# Patient Record
Sex: Male | Born: 1985 | Hispanic: No | Marital: Married | Smoking: Never smoker
Health system: Southern US, Community
[De-identification: ages and names within clinical notes are randomized; demographics above are authoritative.]

## PROBLEM LIST (undated history)

## (undated) DIAGNOSIS — G629 Polyneuropathy, unspecified: Secondary | ICD-10-CM

## (undated) DIAGNOSIS — K219 Gastro-esophageal reflux disease without esophagitis: Secondary | ICD-10-CM

## (undated) HISTORY — DX: Polyneuropathy, unspecified: G62.9

## (undated) HISTORY — PX: OTHER SURGICAL HISTORY: SHX169

---

## 2014-09-11 ENCOUNTER — Encounter: Payer: Self-pay | Admitting: Neurology

## 2014-09-11 ENCOUNTER — Ambulatory Visit (INDEPENDENT_AMBULATORY_CARE_PROVIDER_SITE_OTHER): Payer: BC Managed Care – HMO | Admitting: Neurology

## 2014-09-11 VITALS — BP 113/70 | HR 77 | Temp 97.2°F | Ht 67.0 in | Wt 153.0 lb

## 2014-09-11 DIAGNOSIS — M5481 Occipital neuralgia: Secondary | ICD-10-CM

## 2014-09-11 DIAGNOSIS — M542 Cervicalgia: Secondary | ICD-10-CM

## 2014-09-11 DIAGNOSIS — R29898 Other symptoms and signs involving the musculoskeletal system: Secondary | ICD-10-CM

## 2014-09-11 DIAGNOSIS — R209 Unspecified disturbances of skin sensation: Secondary | ICD-10-CM

## 2014-09-11 DIAGNOSIS — H9312 Tinnitus, left ear: Secondary | ICD-10-CM

## 2014-09-11 DIAGNOSIS — H539 Unspecified visual disturbance: Secondary | ICD-10-CM

## 2014-09-11 DIAGNOSIS — R200 Anesthesia of skin: Secondary | ICD-10-CM

## 2014-09-11 DIAGNOSIS — R208 Other disturbances of skin sensation: Secondary | ICD-10-CM

## 2014-09-11 DIAGNOSIS — IMO0001 Reserved for inherently not codable concepts without codable children: Secondary | ICD-10-CM

## 2014-09-11 NOTE — Progress Notes (Signed)
[facility]    Provider:  Dr Lucia GaskinsAhern Referring Provider: Swazil[place]Jordan, Betty G, MD Primary Care Physician:  SwazilandJORDAN, BETTY G, MD   CC:  Numbness  HPI:  Jonathan Rodriguez is a 28 y.o. male here as a referral from Dr. SwazilandJordan for left interscapular numbness and cool sensation LUE. He has no significant PMHX. He started doing weights 2 months ago. He hurt his bicep and left superior pec, felt soreness after a workout. No tearing sensation or trauma. He has pain in the neck, travels into the shoulder. Descibed as tingling in the scapular region and travels down the arm to digits 4-5 of the left arm. Feels a pulling sensation into digits 4-5 and prickling and needles in the thumb. Feels weakness in the arm. General weakness when he lifts weight, he can lift weight but not as strong. No grip weakness. Symptoms wax and wane, worse at night, 4/10 pain. No other associated symptoms. He has pain that radiates to the left occipital area as well. Pressure and numbness in the occipital and trigeminal areas. He has ringing in the ears. Tenderness and pain in the occipital area. Lightning bolts. Episodic blurred vision.   Review of Systems: Patient complains of symptoms per HPI as well as the following symptoms: weight gain, fatigue, blurred vision, swelling in legs, joint pain, cramps, aching muscles, moles, headache, sleepiness, passing out, anxiety. Pertinent negatives per HPI. All others negative.   History   Social History  . Marital Status: Unknown    Spouse Name: N/A    Number of Children: N/A  . Years of Education: N/A   Occupational History  . Not on file.   Social History Main Topics  . Smoking status: Never Smoker   . Smokeless tobacco: Never Used  . Alcohol Use: No  . Drug Use: No  . Sexual Activity: Not Currently   Other Topics Concern  . Not on file   Social History Narrative    Family History  Problem Relation Age of Onset  . Heart attack Mother   . Heart attack  Paternal Grandfather     Past Medical History  Diagnosis Date  . Neuropathy     Past Surgical History  Procedure Laterality Date  . No surgical history      Current Outpatient Prescriptions  Medication Sig Dispense Refill  . tiZANidine (ZANAFLEX) 4 MG tablet Take 4 mg by mouth every 6 (six) hours as needed for muscle spasms.     No current facility-administered medications for this visit.    Allergies as of 09/11/2014 - Review Complete 09/11/2014  Allergen Reaction Noted  . Naproxen Tinitus 09/11/2014    Vitals: BP 113/70 mmHg  Pulse 77  Temp(Src) 97.2 F (36.2 C) (Oral)  Ht 5\' 7"  (1.702 m)  Wt 153 lb (69.4 kg)  BMI 23.96 kg/m2 Last Weight:  Wt Readings from Last 1 Encounters:  09/11/14 153 lb (69.4 kg)   Last Height:   Ht Readings from Last 1 Encounters:  09/11/14 5\' 7"  (1.702 m)     Physical exam: Exam: Gen: NAD, conversant, well nourised, well groomed                     CV: RRR, no MRG. No Carotid Bruits. No peripheral edema, warm, nontender Eyes: Conjunctivae clear without exudates or hemorrhage  Neuro: Detailed Neurologic Exam  Speech:    Speech is normal; fluent and spontaneous with normal comprehension.  Cognition:    The patient is oriented to  person, place, and time;     recent and remote memory intact;     language fluent;     normal attention, concentration,     fund of knowledge Cranial Nerves:    The pupils are equal, round, and reactive to light. The fundi are normal and spontaneous venous pulsations are present. Visual fields are full to finger confrontation. Extraocular movements are intact. Trigeminal sensation is intact and the muscles of mastication are normal. The face is symmetric. The palate elevates in the midline. Voice is normal. Shoulder shrug is normal. The tongue has normal motion without fasciculations.   Coordination:    Normal finger to nose and heel to shin. Normal rapid alternating movements.   Gait:    Heel-toe and  tandem gait are normal.   Motor Observation:    No asymmetry, no atrophy, and no involuntary movements noted. Tone:    Normal muscle tone.    Posture:    Posture is normal. normal erect    Strength:    Mild weakness of infraspinatus. Strength is V/V in the upper and lower limbs including intrinsic hand muscles.      Sensation: intact to LT     Reflex Exam:  DTR's:    3+ patellar reflexes with + suprapatellar reflexes. Otherwise deep tendon reflexes in the upper and lower extremities are normal bilaterally.   Toes:    The toes are downgoing bilaterally.   Clonus:    Clonus is absent.       Assessment/Plan:  28 year old male with new onset neck pain with radiation into the arm and occipital neuralgia. Will evaluate left arm pain with EMG/NCS of the left upper extremity. Because patient is c/o vision and hearing changes and because structural and infiltrative lesions may cause occipital neuralgia will order MRI of the brain and cervical spine as well.   Naomie DeanAntonia Ahern, MD  [facility] [address] [place], [place] [id number]  Phone [phone] Fax [phone]

## 2014-09-11 NOTE — Patient Instructions (Signed)
Overall you are doing fairly well but I do want to suggest a few things today:   Remember to drink plenty of fluid, eat healthy meals and do not skip any meals. Try to eat protein with a every meal and eat a healthy snack such as fruit or nuts in between meals. Try to keep a regular sleep-wake schedule and try to exercise daily, particularly in the form of walking, 20-30 minutes a day, if you can.   As far as your medications are concerned, I would like to suggest  As far as diagnostic testing: MRI of the brain and neck, emg/ncs  I would like to see you back in one week for emg/ncs, sooner if we need to. Please call us with any interim questions, concerns, problems, updates or refill requests.   Please also call us for any test results so we can go over those with you on the phone.  My clinical assistant and will answer any of your questions and relay your messages to me and also relay most of my messages to you.   Our phone number is [phone]. We also have an after hours call service for urgent matters and there is a physician on-call for urgent questions. For any emergencies you know to call 911 or go to the nearest emergency room

## 2014-09-12 ENCOUNTER — Encounter: Payer: Self-pay | Admitting: Neurology

## 2014-09-12 DIAGNOSIS — H539 Unspecified visual disturbance: Secondary | ICD-10-CM | POA: Insufficient documentation

## 2014-09-12 DIAGNOSIS — M5481 Occipital neuralgia: Secondary | ICD-10-CM | POA: Insufficient documentation

## 2014-09-12 DIAGNOSIS — H9319 Tinnitus, unspecified ear: Secondary | ICD-10-CM | POA: Insufficient documentation

## 2014-09-12 DIAGNOSIS — M542 Cervicalgia: Secondary | ICD-10-CM | POA: Insufficient documentation

## 2014-09-19 ENCOUNTER — Ambulatory Visit (INDEPENDENT_AMBULATORY_CARE_PROVIDER_SITE_OTHER): Payer: BC Managed Care – HMO

## 2014-09-19 DIAGNOSIS — R29898 Other symptoms and signs involving the musculoskeletal system: Secondary | ICD-10-CM

## 2014-09-19 DIAGNOSIS — R208 Other disturbances of skin sensation: Secondary | ICD-10-CM

## 2014-09-19 DIAGNOSIS — IMO0001 Reserved for inherently not codable concepts without codable children: Secondary | ICD-10-CM

## 2014-09-19 DIAGNOSIS — M5481 Occipital neuralgia: Secondary | ICD-10-CM

## 2014-09-19 DIAGNOSIS — R209 Unspecified disturbances of skin sensation: Secondary | ICD-10-CM

## 2014-09-19 DIAGNOSIS — R200 Anesthesia of skin: Secondary | ICD-10-CM

## 2014-09-25 ENCOUNTER — Ambulatory Visit (INDEPENDENT_AMBULATORY_CARE_PROVIDER_SITE_OTHER): Payer: BC Managed Care – HMO | Admitting: Neurology

## 2014-09-25 ENCOUNTER — Ambulatory Visit (INDEPENDENT_AMBULATORY_CARE_PROVIDER_SITE_OTHER): Payer: Self-pay | Admitting: Neurology

## 2014-09-25 DIAGNOSIS — Z0289 Encounter for other administrative examinations: Secondary | ICD-10-CM

## 2014-09-25 DIAGNOSIS — IMO0001 Reserved for inherently not codable concepts without codable children: Secondary | ICD-10-CM

## 2014-09-25 DIAGNOSIS — R29898 Other symptoms and signs involving the musculoskeletal system: Secondary | ICD-10-CM

## 2014-09-25 DIAGNOSIS — R209 Unspecified disturbances of skin sensation: Secondary | ICD-10-CM

## 2014-09-25 DIAGNOSIS — G243 Spasmodic torticollis: Secondary | ICD-10-CM

## 2014-09-25 DIAGNOSIS — S43422D Sprain of left rotator cuff capsule, subsequent encounter: Secondary | ICD-10-CM

## 2014-09-25 DIAGNOSIS — R202 Paresthesia of skin: Secondary | ICD-10-CM

## 2014-09-25 NOTE — Progress Notes (Signed)
  [facility]    Provider:  Dr Lucia GaskinsAhern Referring Provider: [place]il[place]Jordan, Betty G, MD Primary Care Physician:  SwazilandJORDAN, BETTY G, MD  HPI:  Jonathan Rodriguez is a 28 y.o. male here as a referral from Dr. SwazilandJordan for tingling in the left rotator cuff area and lower border of the medial left scapula, feels tightness. He has no significant PMHX. Symptoms started 2 months ago after doing heavy weights, he felt some pain the digits 4-5 and in the pectorals after chest press. He hurt his bicep and left superior pec, felt soreness after a workout. No tearing sensation or trauma.Has has some pain in the left side of the neck that travels into the shoulder. He also had some coldness in the left side of the face.  He also has shooting pain in his left triceps. Exam demonstrates decreased ROM in the neck and horizontal turning of the head (torticollis), left-sided cervical increased tone.   Summary  Nerve conduction studies were performed on the left upper extremity  The left Median motor nerve showed normal conductions with normal F Wave latency The left Ulnar motor nerve showed normal conductions with normal F Wave latency The left second-digit Median sensory nerve conduction was within normal limits The left fifth-digit Ulnar sensory nerve conduction was within normal limits The left Radial sensory nerve conduction was within normal limits The left Medial Antebrachial Cutaneous sensory nerve conduction was within normal limits The left Median/Ulnar (palm) comparison nerve showed normal peak latency difference  EMG Needle study was performed on selected left upper extremity and paraspinal muscles:   The Left upper Trapezius, C6/C7/C8 paraspinals and Levator showed a persistent full interference pattern. The Infraspinatus, Supraspinatus, Deltoid, Triceps, Pronator Teres, Flexor Digitorum Profundus (Ulnar), Opponens Pollicis, First Dorsal interosseous muscles were within normal  limits.  Conclusion: No electrophysiologic evidence for ulnar or median neuropathy, peripheral polyneuropathy or radiculopathy. Cervical Dystonia was appreciated on EMG needle exam. Clinical correlation recommended.       Naomie DeanAntonia Ahern, MD  [facility] [address] [place], [place] [address]  Phone [phone] Fax [phone]

## 2014-09-27 MED ORDER — TIZANIDINE HCL 4 MG PO TABS: 4.0000 mg | ORAL_TABLET | Freq: Three times a day (TID) | ORAL | Status: DC | PRN

## 2014-09-27 NOTE — Addendum Note (Signed)
Addended by: Naomie DeanAHERN, ANTONIA B on: 09/27/2014 11:03 AM   Modules accepted: Orders

## 2014-10-02 ENCOUNTER — Telehealth: Payer: Self-pay

## 2014-10-02 NOTE — Telephone Encounter (Signed)
Spoke to patient. Gave results.

## 2014-10-02 NOTE — Telephone Encounter (Signed)
-----   Message from Anson FretAntonia B Ahern, MD sent at 09/23/2014  8:23 PM EST ----- Please let patient know the MRI of his cervical spine and MRI of his brain were both normal. Thank you.

## 2014-10-03 ENCOUNTER — Encounter: Payer: Self-pay | Admitting: Neurology

## 2014-10-03 ENCOUNTER — Ambulatory Visit (INDEPENDENT_AMBULATORY_CARE_PROVIDER_SITE_OTHER): Payer: BC Managed Care – HMO | Admitting: Neurology

## 2014-10-03 VITALS — BP 102/66 | HR 65 | Ht 67.0 in | Wt 154.0 lb

## 2014-10-03 DIAGNOSIS — R339 Retention of urine, unspecified: Secondary | ICD-10-CM

## 2014-10-03 DIAGNOSIS — R42 Dizziness and giddiness: Secondary | ICD-10-CM

## 2014-10-03 DIAGNOSIS — M545 Low back pain: Secondary | ICD-10-CM

## 2014-10-03 DIAGNOSIS — R29898 Other symptoms and signs involving the musculoskeletal system: Secondary | ICD-10-CM

## 2014-10-03 DIAGNOSIS — H9313 Tinnitus, bilateral: Secondary | ICD-10-CM

## 2014-10-03 MED ORDER — CARISOPRODOL 350 MG PO TABS: 350.0000 mg | ORAL_TABLET | Freq: Two times a day (BID) | ORAL | Status: DC

## 2014-10-03 NOTE — Progress Notes (Signed)
[facility]    Provider:  Dr Lucia GaskinsAhern Referring Provider: SwazilandJordan, Betty G, MD Primary Care Physician:  SwazilandJORDAN, BETTY G, MD  CC: Back pain  Interval History: He is going to physical therapy for his arm and back. He is going weekly. His left hip is tight and so are shoulders in the back. He had a massage which relaxed his shoulder muscles. PT suggested postures and he is feeling better. He has burning pain in the low back and sacral area. He feels weakness in the legs. Burning radiates into the buttocks.  Burning pain across the low back. Worse when he sleeps on his low back. Coldness in is feet with parestheias down the legs. He has urgency and feeling of urinary retension.   He has continuous ringing in the ears. This started after taking naproxen for his arm and back pain 2 months ago. Ringing is persistent and disturbing. No hearing loss, No vertigo. He endorses congestion. He uses noise cancellation headsets but stopped using them because the the noise in his head is worse than outside his head.   November 11th: Jonathan Rodriguez is a 28 y.o. male here as a referral from Dr. Sw[place]il[place]Jordan for left interscapular numbness and cool sensation LUE. He has no significant PMHX. He started doing weights 2 months ago. He hurt his bicep and left superior pec, felt soreness after a workout. No tearing sensation or trauma. He has pain in the neck, travels into the shoulder. Descibed as tingling in the scapular region and travels down the arm to digits 4-5 of the left arm. Feels a pulling sensation into digits 4-5 and prickling and needles in the thumb. Feels weakness in the arm. General weakness when he lifts weight, he can lift weight but not as strong. No grip weakness. Symptoms wax and wane, worse at night, 4/10 pain. No other associated symptoms. He has pain that radiates to the left occipital area as well. Pressure and numbness in the occipital and trigeminal areas. He has ringing in the  ears. Tenderness and pain in the occipital area. Lightning bolts. Episodic blurred vision.   Review of Systems: Patient complains of symptoms per HPI as well as the following symptoms: Dizziness, fatigue, blurred vision, joint pain, cramps, aching muscles, moles, headache, sleepiness,  anxiety. Pertinent negatives per HPI. All others negative.   History   Social History  . Marital Status: Unknown    Spouse Name: N/A    Number of Children: N/A  . Years of Education: N/A   Occupational History  . Not on file.   Social History Main Topics  . Smoking status: Never Smoker   . Smokeless tobacco: Never Used  . Alcohol Use: No  . Drug Use: No  . Sexual Activity: Not Currently   Other Topics Concern  . Not on file   Social History Narrative   Patient lives alone.    Patient is single   Patient has a Masters degree   Patient is right handed     Family History  Problem Relation Age of Onset  . Heart attack Mother   . Heart attack Paternal Grandfather     Past Medical History  Diagnosis Date  . Neuropathy     Past Surgical History  Procedure Laterality Date  . No surgical history      Current Outpatient Prescriptions  Medication Sig Dispense Refill  . tiZANidine (ZANAFLEX) 4 MG tablet Take 1 tablet (4 mg total) by mouth every 8 (eight) hours  as needed for muscle spasms. 30 tablet 3  . carisoprodol (SOMA) 350 MG tablet Take 1 tablet (350 mg total) by mouth 2 (two) times daily. 60 tablet 3   No current facility-administered medications for this visit.    Allergies as of 10/03/2014 - Review Complete 10/03/2014  Allergen Reaction Noted  . Naproxen Tinitus 09/11/2014    Vitals: BP 102/66 mmHg  Pulse 65  Ht 5\' 7"  (1.702 m)  Wt 154 lb (69.854 kg)  BMI 24.11 kg/m2 Last Weight:  Wt Readings from Last 1 Encounters:  10/03/14 154 lb (69.854 kg)   Last Height:   Ht Readings from Last 1 Encounters:  10/03/14 5\' 7"  (1.702 m)    Physical exam: Exam: Gen: NAD,  conversant, well nourised, well groomed                     CV: RRR, no MRG. No Carotid Bruits. No peripheral edema, warm, nontender Eyes: Conjunctivae clear without exudates or hemorrhage  Neuro: Detailed Neurologic Exam  Speech:    Speech is normal; fluent and spontaneous with normal comprehension.  Cognition:    The patient is oriented to person, place, and time;     recent and remote memory intact;     language fluent;     normal attention, concentration,     fund of knowledge Cranial Nerves:    The pupils are equal, round, and reactive to light. The fundi are normal and spontaneous venous pulsations are present. Visual fields are full to finger confrontation. Extraocular movements are intact. Trigeminal sensation is intact and the muscles of mastication are normal. The face is symmetric. The palate elevates in the midline. Voice is normal. Shoulder shrug is normal. The tongue has normal motion without fasciculations.   Coordination:    Normal finger to nose and heel to shin. Normal rapid alternating movements.   Gait:    Heel-toe and tandem gait are normal.   Motor Observation:    No asymmetry, no atrophy, and no involuntary movements noted. Tone:    Normal muscle tone.    Posture:    Posture is normal. normal erect    Strength:    Strength is V/V in the upper and lower limbs.      Sensation: intact     Reflex Exam:  DTR's:    Brisk patellars and +suprapatellars. Otherwise deep tendon reflexes in the upper and lower extremities are normal bilaterally.   Toes:    The toes are downgoing bilaterally.   Clonus:    Clonus is absent.      Assessment/Plan:  28 year old male with no significant PMHx here for follow up with multiple neurologic and somatic complaints. Suspect some anxiety. Was seen originally for new onset neck pain with radiation into the arm and occipital neuralgia. EMG/NCS left arm was essentially normal. MRI of the brain and cervical spine were  unremarkable. Neuro exam with brisk patellar reflexes and suprapatellar reflexes, otherwise WNL. Today he complains of LBP and ringing in the ears.   Tinnitus: Will refer to ENT for persistent, bothersome, ringing in the ears after taking Naproxen. LBP with radiation into the buttocks, paresthesias in the legs, urinary changes,weakness: MRI of the lumbar spine Continue PT for arm pain and LBP  Naomie DeanAntonia Ahern, MD  [facility] [address] [place], [place] [id number]  Phone [phone] Fax [phone]

## 2014-10-03 NOTE — Patient Instructions (Signed)
Overall you are doing fairly well but I do want to suggest a few things today:   Remember to drink plenty of fluid, eat healthy meals and do not skip any meals. Try to eat protein with a every meal and eat a healthy snack such as fruit or nuts in between meals. Try to keep a regular sleep-wake schedule and try to exercise daily, particularly in the form of walking, 20-30 minutes a day, if you can.   As far as diagnostic testing: MRI of the lumbar spine, ENT referral, continue PT  I would like to see you back in 3 months, sooner if we need to. Please call us with any interim questions, concerns, problems, updates or refill requests.   Please also call us for any test results so we can go over those with you on the phone.  My clinical assistant and will answer any of your questions and relay your messages to me and also relay most of my messages to you.   Our phone number is [phone]. We also have an after hours call service for urgent matters and there is a physician on-call for urgent questions. For any emergencies you know to call 911 or go to the nearest emergency room

## 2014-10-06 DIAGNOSIS — M545 Low back pain, unspecified: Secondary | ICD-10-CM | POA: Insufficient documentation

## 2014-10-09 ENCOUNTER — Ambulatory Visit (INDEPENDENT_AMBULATORY_CARE_PROVIDER_SITE_OTHER): Payer: BC Managed Care – HMO

## 2014-10-09 DIAGNOSIS — M545 Low back pain: Secondary | ICD-10-CM

## 2014-10-09 DIAGNOSIS — R29898 Other symptoms and signs involving the musculoskeletal system: Secondary | ICD-10-CM

## 2014-10-09 DIAGNOSIS — S43422D Sprain of left rotator cuff capsule, subsequent encounter: Secondary | ICD-10-CM

## 2014-10-09 DIAGNOSIS — R339 Retention of urine, unspecified: Secondary | ICD-10-CM

## 2014-10-14 ENCOUNTER — Telehealth: Payer: Self-pay | Admitting: Neurology

## 2014-10-14 NOTE — Telephone Encounter (Signed)
Spoke to patient regarding MRI lumbar spine results. Mild degenerative changes.  Equivocal MRI lumbar spine (without) demonstrating: 1. Trivial disc bulging from L3-4 to L5-S1, and multi-level facet hypertrophy. 2. No spinal stenosis or foraminal narrowing.

## 2014-10-18 ENCOUNTER — Other Ambulatory Visit: Payer: Self-pay | Admitting: Neurology

## 2014-10-18 ENCOUNTER — Telehealth: Payer: Self-pay | Admitting: Neurology

## 2014-10-18 DIAGNOSIS — S43422A Sprain of left rotator cuff capsule, initial encounter: Secondary | ICD-10-CM

## 2014-10-18 NOTE — Telephone Encounter (Signed)
Called patient. Reffered to Ortho after MRI of the left shoulder showed apparent degenerative signal fraying of the posterior labrum without evidence of displaced tear, mild tendinosis of the supraspinatus without tear, Type II acromion with prominent posterior inferior tilt. Will leave MRI CD and report at the front desk for patient to pick up.

## 2014-10-21 ENCOUNTER — Encounter: Payer: Self-pay | Admitting: Neurology

## 2015-01-28 ENCOUNTER — Ambulatory Visit: Payer: BC Managed Care – HMO | Admitting: Neurology

## 2016-03-22 ENCOUNTER — Telehealth: Payer: Self-pay | Admitting: Neurology

## 2016-03-22 NOTE — Telephone Encounter (Signed)
Called pt back. Made earlier f/u for 03/31/16 at 4pm, check in 345pm. I cx 6.6.17 appt. He states he does not have PCP right now. He is in process of establishing himself with new PCP. I advised him to go to ED if he has new/worsening sx in the meantime before he comes for f/u. He verbalized understanding.

## 2016-03-22 NOTE — Telephone Encounter (Signed)
Patient reports new onset of HA's that started on Friday evening 03/19/16.  An appointment was made for 04/06/16. Pt is requesting to be seen sooner, operator advised that the nurse would call and let him know if he could be seen sooner.

## 2016-03-22 NOTE — Telephone Encounter (Signed)
Per Dr Lucia GaskinsAhern- ok to fit pt in next week for appt. I will call pt

## 2016-03-31 ENCOUNTER — Encounter: Payer: Self-pay | Admitting: Neurology

## 2016-03-31 ENCOUNTER — Ambulatory Visit (INDEPENDENT_AMBULATORY_CARE_PROVIDER_SITE_OTHER): Payer: 59 | Admitting: Neurology

## 2016-03-31 VITALS — BP 118/75 | HR 67 | Ht 67.0 in | Wt 157.4 lb

## 2016-03-31 DIAGNOSIS — M62830 Muscle spasm of back: Secondary | ICD-10-CM

## 2016-03-31 DIAGNOSIS — M542 Cervicalgia: Secondary | ICD-10-CM | POA: Diagnosis not present

## 2016-03-31 DIAGNOSIS — M545 Low back pain, unspecified: Secondary | ICD-10-CM

## 2016-03-31 MED ORDER — CYCLOBENZAPRINE HCL 5 MG PO TABS: 5.0000 mg | ORAL_TABLET | Freq: Every day | ORAL | Status: DC

## 2016-03-31 NOTE — Progress Notes (Signed)
[facility]    Provider:  Dr Lucia GaskinsAhern Primary Care Physician:  Sissy HoffSWAYNE,[name], MD  CC: Back pain  Interval history 03/31/2016: He felt weak 10 days ago. He has lumbar pain in the back. gneralized weakness and pain in the lumbar area in the muscles, tightness. He felt dizziness 10 days ago. He had some left pectoral pain when working out. The pain is from the left pectoral muscle. When he closes his eyes he feels a little shaking in the head. He has other multiple pain complaints. He is still very active. He has right knee pain and burning sensation.  Discussed he has a plethora of symptoms, maybe this is stress. Tried to reassure patient that he is fine.  Interval History: He is going to physical therapy for his arm and back. He is going weekly. His left hip is tight and so are shoulders in the back. He had a massage which relaxed his shoulder muscles. PT suggested postures and he is feeling better. He has burning pain in the low back and sacral area. He feels weakness in the legs. Burning radiates into the buttocks. Burning pain across the low back. Worse when he sleeps on his low back. Coldness in is feet with parestheias down the legs. He has urgency and feeling of urinary retension.   He has continuous ringing in the ears. This started after taking naproxen for his arm and back pain 2 months ago. Ringing is persistent and disturbing. No hearing loss, No vertigo. He endorses congestion. He uses noise cancellation headsets but stopped using them because the the noise in his head is worse than outside his head.   November 11th: Jonathan Rodriguez is a 30 y.o. male here as a referral from Dr. [place] for left interscapular numbness and cool sensation LUE. He has no significant PMHX. He started doing weights 2 months ago. He hurt his bicep and left superior pec, felt soreness after a workout. No tearing sensation or trauma. He has pain in the neck, travels into the shoulder. Descibed  as tingling in the scapular region and travels down the arm to digits 4-5 of the left arm. Feels a pulling sensation into digits 4-5 and prickling and needles in the thumb. Feels weakness in the arm. General weakness when he lifts weight, he can lift weight but not as strong. No grip weakness. Symptoms wax and wane, worse at night, 4/10 pain. No other associated symptoms. He has pain that radiates to the left occipital area as well. Pressure and numbness in the occipital and trigeminal areas. He has ringing in the ears. Tenderness and pain in the occipital area. Lightning bolts. Episodic blurred vision  Review of Systems: Patient complains of symptoms per HPI as well as the following symptoms: Dizziness, fatigue, blurred vision, joint pain, cramps, aching muscles, moles, headache, sleepiness, anxiety. Pertinent negatives per HPI. All others negative  Social History   Social History  . Marital Status: Unknown    Spouse Name: N/A  . Number of Children: 0  . Years of Education: Masters   Occupational History  . [employer]    Social History Main Topics  . Smoking status: Never Smoker   . Smokeless tobacco: Never Used  . Alcohol Use: No  . Drug Use: No  . Sexual Activity: Not Currently   Other Topics Concern  . Not on file   Social History Narrative   Patient lives alone.    Patient is single   Patient has a Event organiserMasters degree  Patient is right handed     Family History  Problem Relation Age of Onset  . Heart attack Mother   . Heart attack Paternal Grandfather     Past Medical History  Diagnosis Date  . Neuropathy [facility])     Past Surgical History  Procedure Laterality Date  . No surgical history      No current outpatient prescriptions on file.   No current facility-administered medications for this visit.    Allergies as of 03/31/2016 - Review Complete 03/31/2016  Allergen Reaction Noted  . Naproxen Tinitus 09/11/2014    Vitals: BP 118/75 mmHg  Pulse 67  Ht   (1.702 m)  Wt 157 lb 6.4 oz (71.396 kg)  BMI 24.65 kg/m2 Last Weight:  Wt Readings from Last 1 Encounters:  03/31/16 157 lb 6.4 oz (71.396 kg)   Last Height:   Ht Readings from Last 1 Encounters:  03/31/16  (1.702 m)     Neuro: Detailed Neurologic Exam  Speech:  Speech is normal; fluent and spontaneous with normal comprehension.  Cognition:  The patient is oriented to person, place, and time;   recent and remote memory intact;   language fluent;   normal attention, concentration,   fund of knowledge Cranial Nerves:  The pupils are equal, round, and reactive to light. The fundi are normal and spontaneous venous pulsations are present. Visual fields are full to finger confrontation. Extraocular movements are intact. Trigeminal sensation is intact and the muscles of mastication are normal. The face is symmetric. The palate elevates in the midline. Voice is normal. Shoulder shrug is normal. The tongue has normal motion without fasciculations.   Coordination:  Normal finger to nose and heel to shin. Normal rapid alternating movements.   Gait:  Heel-toe and tandem gait are normal.   Motor Observation:  No asymmetry, no atrophy, and no involuntary movements noted. Tone:  Normal muscle tone.   Posture:  Posture is normal. normal erect   Strength:  Strength is V/V in the upper and lower limbs.    Sensation: intact   Reflex Exam:  DTR's:  Brisk patellars and +suprapatellars. Otherwise deep tendon reflexes in the upper and lower extremities are normal bilaterally.  Toes:  The toes are downgoing bilaterally.  Clonus:  Clonus is absent.     Assessment/Plan: 30 year old male with no significant PMHx here for follow up with multiple neurologic and somatic complaints. Suspect some anxiety. Was seen originally for new onset neck pain with radiation into the arm and occipital neuralgia. EMG/NCS left arm was  essentially normal. MRI of the brain and cervical spine were unremarkable. Neuro exam with brisk patellar reflexes and suprapatellar reflexes, otherwise WNL. Today he complains of LBP and ringing in the ears.   Flexeril for musculoskeletal back pain Referral to integrated services for manual therapy and massage, acupuncture.  Naomie Dean, MD  [facility] [address] [place], [place] [address]  Phone [phone] Fax [phone]  A total of 30 minutes minutes was spent in with this patient face-to-face. Over half this time was spent on counseling patient on the low back pain diagnosis and different therapeutic options available.

## 2016-03-31 NOTE — Patient Instructions (Signed)
Remember to drink plenty of fluid, eat healthy meals and do not skip any meals. Try to eat protein with a every meal and eat a healthy snack such as fruit or nuts in between meals. Try to keep a regular sleep-wake schedule and try to exercise daily, particularly in the form of walking, 20-30 minutes a day, if you can.   As far as your medications are concerned, I would like to suggest: flexeril at night 5mg  Integrative therapies for acupuncture, PT and manual therapy/massage and stretching and heat  I would like to see you back as needed, sooner if we need to. Please call us with any interim questions, concerns, problems, updates or refill requests.   Our phone number is [phone]. We also have an after hours call service for urgent matters and there is a physician on-call for urgent questions. For any emergencies you know to call 911 or go to the nearest emergency room

## 2016-04-03 ENCOUNTER — Encounter: Payer: Self-pay | Admitting: Neurology

## 2016-04-06 ENCOUNTER — Ambulatory Visit: Payer: Self-pay | Admitting: Neurology

## 2016-05-11 ENCOUNTER — Other Ambulatory Visit: Payer: Self-pay | Admitting: Family Medicine

## 2016-05-11 DIAGNOSIS — N50811 Right testicular pain: Secondary | ICD-10-CM

## 2016-05-17 ENCOUNTER — Ambulatory Visit
Admission: RE | Admit: 2016-05-17 | Discharge: 2016-05-17 | Disposition: A | Discharge status: Home / Self Care | Payer: 59 | Source: Ambulatory Visit | Attending: Family Medicine | Admitting: Family Medicine

## 2016-05-17 DIAGNOSIS — N50811 Right testicular pain: Secondary | ICD-10-CM

## 2016-10-15 ENCOUNTER — Other Ambulatory Visit: Payer: Self-pay | Admitting: Family Medicine

## 2016-10-15 DIAGNOSIS — IMO0002 Reserved for concepts with insufficient information to code with codable children: Secondary | ICD-10-CM

## 2016-10-15 DIAGNOSIS — R229 Localized swelling, mass and lump, unspecified: Principal | ICD-10-CM

## 2016-10-18 ENCOUNTER — Ambulatory Visit
Admission: RE | Admit: 2016-10-18 | Discharge: 2016-10-18 | Disposition: A | Discharge status: Home / Self Care | Payer: 59 | Source: Ambulatory Visit | Attending: Family Medicine | Admitting: Family Medicine

## 2016-10-18 DIAGNOSIS — IMO0002 Reserved for concepts with insufficient information to code with codable children: Secondary | ICD-10-CM

## 2016-10-18 DIAGNOSIS — R229 Localized swelling, mass and lump, unspecified: Principal | ICD-10-CM

## 2016-11-22 DIAGNOSIS — Z0000 Encounter for general adult medical examination without abnormal findings: Secondary | ICD-10-CM | POA: Diagnosis not present

## 2016-11-25 DIAGNOSIS — E7800 Pure hypercholesterolemia, unspecified: Secondary | ICD-10-CM | POA: Diagnosis not present

## 2017-06-15 DIAGNOSIS — J029 Acute pharyngitis, unspecified: Secondary | ICD-10-CM | POA: Diagnosis not present

## 2017-07-01 DIAGNOSIS — H5319 Other subjective visual disturbances: Secondary | ICD-10-CM | POA: Diagnosis not present

## 2017-07-01 DIAGNOSIS — Z135 Encounter for screening for eye and ear disorders: Secondary | ICD-10-CM | POA: Diagnosis not present

## 2017-07-18 DIAGNOSIS — Z23 Encounter for immunization: Secondary | ICD-10-CM | POA: Diagnosis not present

## 2017-07-18 DIAGNOSIS — R5383 Other fatigue: Secondary | ICD-10-CM | POA: Diagnosis not present

## 2017-08-02 DIAGNOSIS — Z23 Encounter for immunization: Secondary | ICD-10-CM | POA: Diagnosis not present

## 2017-09-09 DIAGNOSIS — R0981 Nasal congestion: Secondary | ICD-10-CM | POA: Diagnosis not present

## 2017-09-26 DIAGNOSIS — J302 Other seasonal allergic rhinitis: Secondary | ICD-10-CM | POA: Diagnosis not present

## 2017-09-26 DIAGNOSIS — H9313 Tinnitus, bilateral: Secondary | ICD-10-CM | POA: Diagnosis not present

## 2017-10-19 DIAGNOSIS — E559 Vitamin D deficiency, unspecified: Secondary | ICD-10-CM | POA: Diagnosis not present

## 2017-10-26 DIAGNOSIS — M25561 Pain in right knee: Secondary | ICD-10-CM | POA: Diagnosis not present

## 2017-10-26 DIAGNOSIS — G8929 Other chronic pain: Secondary | ICD-10-CM | POA: Diagnosis not present

## 2017-10-28 ENCOUNTER — Other Ambulatory Visit: Payer: Self-pay | Admitting: Specialist

## 2017-10-28 DIAGNOSIS — M25561 Pain in right knee: Principal | ICD-10-CM

## 2017-10-28 DIAGNOSIS — G8929 Other chronic pain: Secondary | ICD-10-CM

## 2017-10-31 ENCOUNTER — Ambulatory Visit
Admission: RE | Admit: 2017-10-31 | Discharge: 2017-10-31 | Disposition: A | Discharge status: Home / Self Care | Payer: 59 | Source: Ambulatory Visit | Attending: Specialist | Admitting: Specialist

## 2017-10-31 DIAGNOSIS — M25561 Pain in right knee: Principal | ICD-10-CM

## 2017-10-31 DIAGNOSIS — G8929 Other chronic pain: Secondary | ICD-10-CM

## 2017-11-03 DIAGNOSIS — M25561 Pain in right knee: Secondary | ICD-10-CM | POA: Diagnosis not present

## 2017-11-03 DIAGNOSIS — M545 Low back pain: Secondary | ICD-10-CM | POA: Diagnosis not present

## 2017-11-03 DIAGNOSIS — M5116 Intervertebral disc disorders with radiculopathy, lumbar region: Secondary | ICD-10-CM | POA: Diagnosis not present

## 2017-11-11 DIAGNOSIS — M545 Low back pain: Secondary | ICD-10-CM | POA: Diagnosis not present

## 2017-11-21 ENCOUNTER — Other Ambulatory Visit: Payer: Self-pay | Admitting: Family Medicine

## 2017-11-21 ENCOUNTER — Ambulatory Visit
Admission: RE | Admit: 2017-11-21 | Discharge: 2017-11-21 | Disposition: A | Discharge status: Home / Self Care | Payer: 59 | Source: Ambulatory Visit | Attending: Family Medicine | Admitting: Family Medicine

## 2017-11-21 DIAGNOSIS — R05 Cough: Secondary | ICD-10-CM

## 2017-11-21 DIAGNOSIS — R059 Cough, unspecified: Secondary | ICD-10-CM

## 2017-11-21 DIAGNOSIS — M545 Low back pain: Secondary | ICD-10-CM | POA: Diagnosis not present

## 2017-11-29 DIAGNOSIS — M545 Low back pain: Secondary | ICD-10-CM | POA: Diagnosis not present

## 2017-12-05 DIAGNOSIS — M25561 Pain in right knee: Secondary | ICD-10-CM | POA: Diagnosis not present

## 2018-05-02 DIAGNOSIS — M778 Other enthesopathies, not elsewhere classified: Secondary | ICD-10-CM | POA: Diagnosis not present

## 2018-05-02 DIAGNOSIS — M25532 Pain in left wrist: Secondary | ICD-10-CM | POA: Diagnosis not present

## 2018-08-08 DIAGNOSIS — H5319 Other subjective visual disturbances: Secondary | ICD-10-CM | POA: Diagnosis not present

## 2018-08-08 DIAGNOSIS — Z135 Encounter for screening for eye and ear disorders: Secondary | ICD-10-CM | POA: Diagnosis not present

## 2018-10-19 DIAGNOSIS — M79671 Pain in right foot: Secondary | ICD-10-CM | POA: Diagnosis not present

## 2018-10-20 DIAGNOSIS — M25561 Pain in right knee: Secondary | ICD-10-CM | POA: Diagnosis not present

## 2018-10-26 DIAGNOSIS — M25561 Pain in right knee: Secondary | ICD-10-CM | POA: Diagnosis not present

## 2018-10-31 DIAGNOSIS — J029 Acute pharyngitis, unspecified: Secondary | ICD-10-CM | POA: Diagnosis not present

## 2018-10-31 DIAGNOSIS — H65199 Other acute nonsuppurative otitis media, unspecified ear: Secondary | ICD-10-CM | POA: Diagnosis not present

## 2018-11-14 DIAGNOSIS — M25561 Pain in right knee: Secondary | ICD-10-CM | POA: Diagnosis not present

## 2019-01-17 IMAGING — CR DG CHEST 2V
2 series · 2 of 2 positions shown · non-contrast
Comparison: None.

CLINICAL DATA: Cough for 10 days.  Sinus drainage.

EXAM:
CHEST  2 VIEW

[w chest pa]
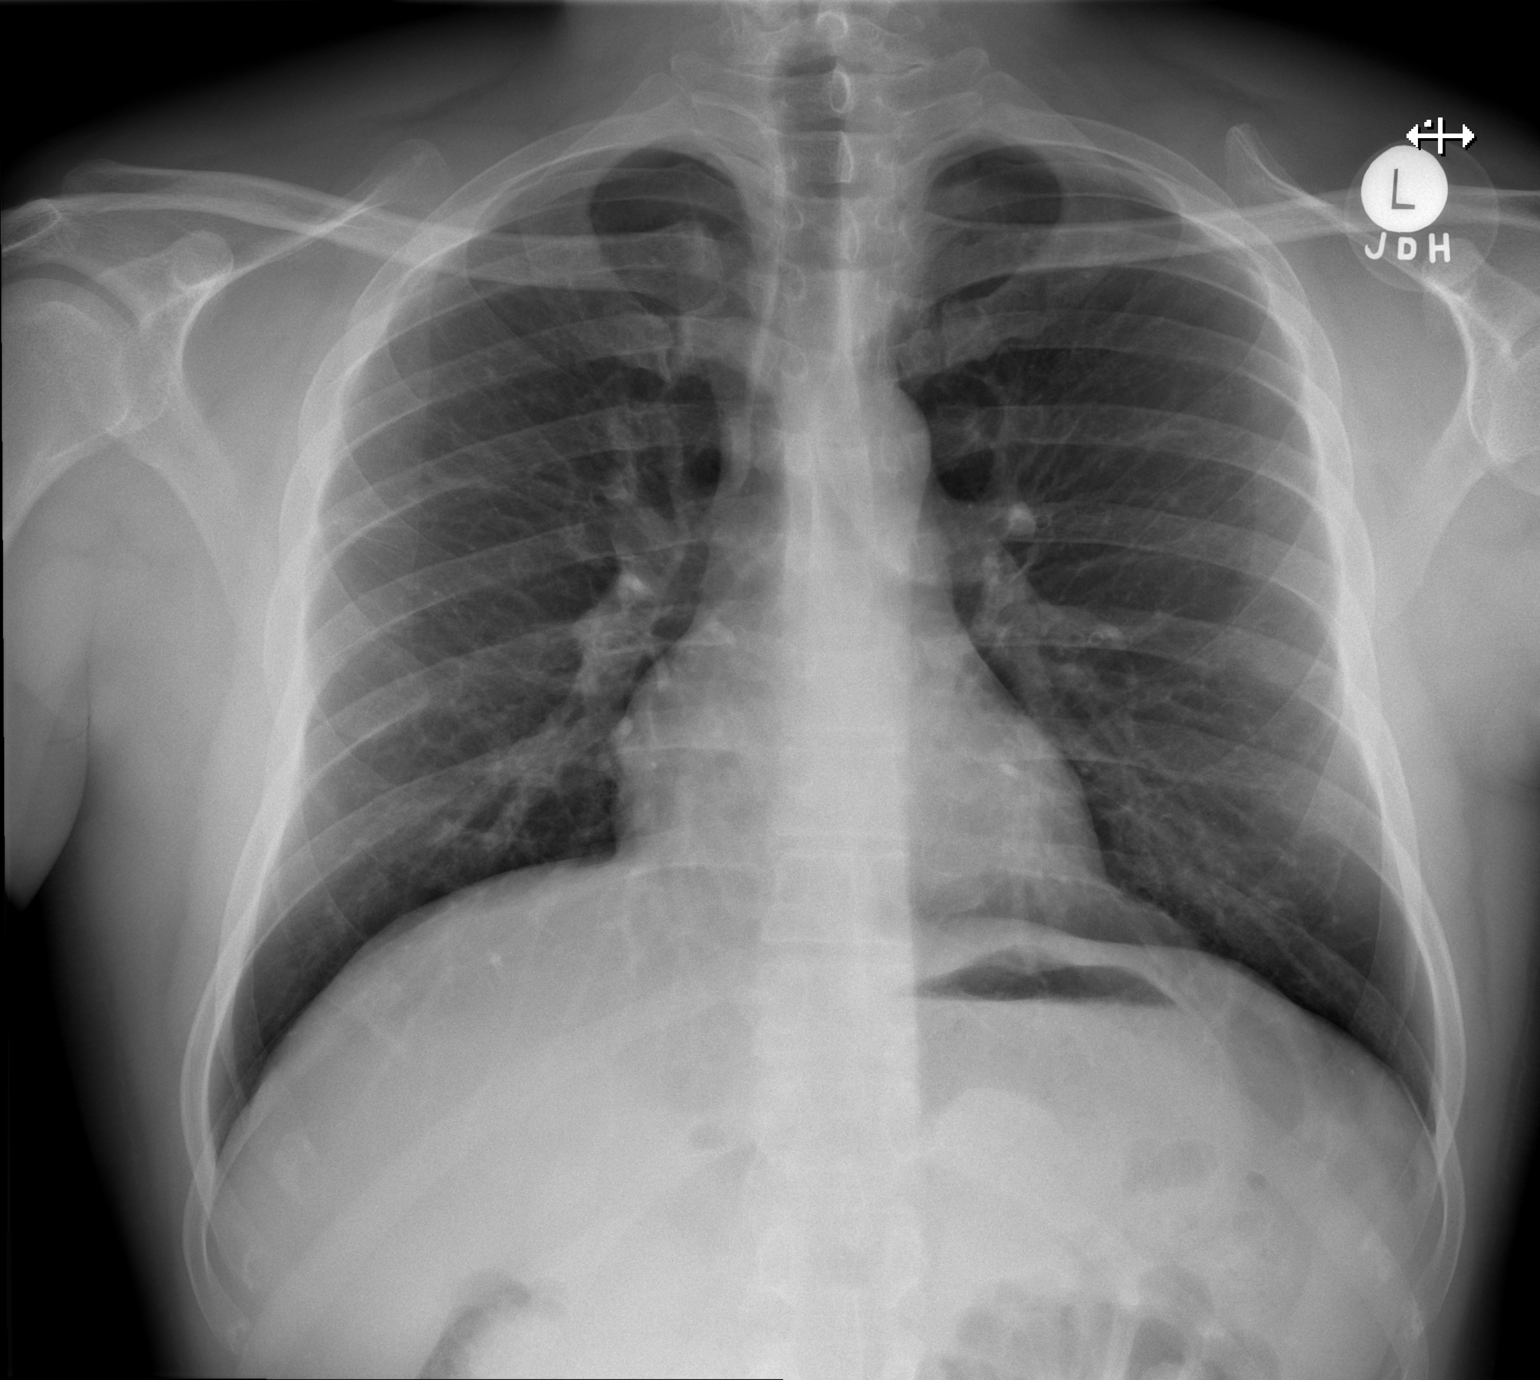

[w chest lat]
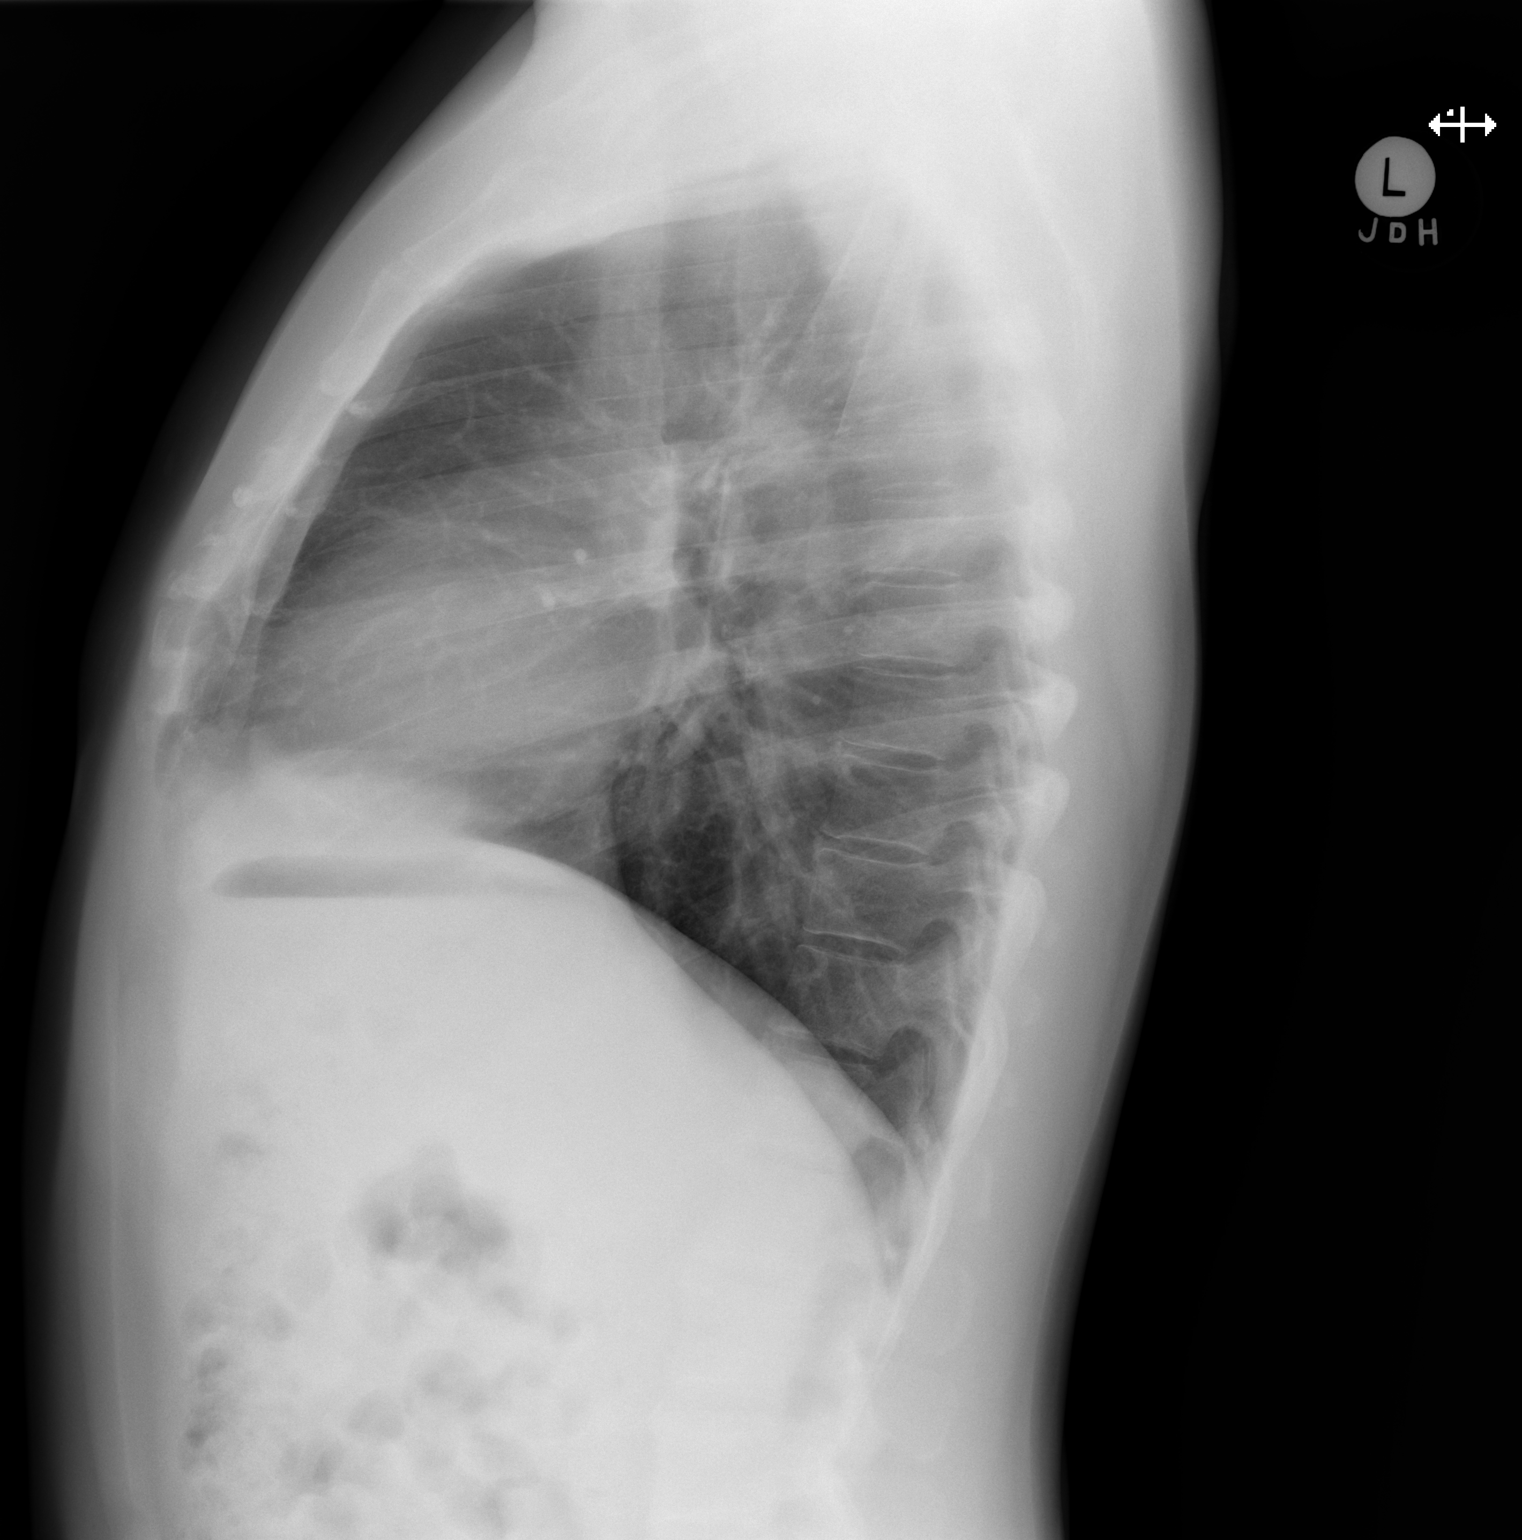

[2 of 2 positions shown; findings below may reference images not displayed]

FINDINGS: The heart size and mediastinal contours are within normal limits.
Both lungs are clear. The visualized skeletal structures are
unremarkable.
IMPRESSION: Normal study.  No active cardiopulmonary disease.

## 2019-11-02 HISTORY — PX: LIPOMA EXCISION: SHX5283

## 2020-10-29 ENCOUNTER — Emergency Department (HOSPITAL_BASED_OUTPATIENT_CLINIC_OR_DEPARTMENT_OTHER)
Admission: EM | Admit: 2020-10-29 | Discharge: 2020-10-29 | Disposition: A | Discharge status: Home / Self Care | Payer: 59 | Attending: Emergency Medicine | Admitting: Emergency Medicine

## 2020-10-29 ENCOUNTER — Encounter (HOSPITAL_BASED_OUTPATIENT_CLINIC_OR_DEPARTMENT_OTHER): Payer: Self-pay

## 2020-10-29 ENCOUNTER — Other Ambulatory Visit: Payer: Self-pay

## 2020-10-29 DIAGNOSIS — Z20822 Contact with and (suspected) exposure to covid-19: Secondary | ICD-10-CM | POA: Diagnosis not present

## 2020-10-29 DIAGNOSIS — R42 Dizziness and giddiness: Secondary | ICD-10-CM | POA: Diagnosis not present

## 2020-10-29 DIAGNOSIS — E876 Hypokalemia: Secondary | ICD-10-CM | POA: Diagnosis not present

## 2020-10-29 HISTORY — DX: Gastro-esophageal reflux disease without esophagitis: K21.9

## 2020-10-29 LAB — URINALYSIS, ROUTINE W REFLEX MICROSCOPIC
Bilirubin Urine: NEGATIVE
Glucose, UA: NEGATIVE mg/dL
Hgb urine dipstick: NEGATIVE
Ketones, ur: NEGATIVE mg/dL
Leukocytes,Ua: NEGATIVE
Nitrite: NEGATIVE
Protein, ur: NEGATIVE mg/dL
Specific Gravity, Urine: 1.005 (ref 1.005–1.030)
pH: 7 (ref 5.0–8.0)

## 2020-10-29 LAB — CBG MONITORING, ED: Glucose-Capillary: 114 mg/dL — ABNORMAL HIGH (ref 70–99)

## 2020-10-29 LAB — BASIC METABOLIC PANEL
Anion gap: 11 (ref 5–15)
BUN: 17 mg/dL (ref 6–20)
CO2: 23 mmol/L (ref 22–32)
Calcium: 9.2 mg/dL (ref 8.9–10.3)
Chloride: 97 mmol/L — ABNORMAL LOW (ref 98–111)
Creatinine, Ser: 1.02 mg/dL (ref 0.61–1.24)
GFR, Estimated: >60 mL/min (ref >60)
Glucose, Bld: 141 mg/dL — ABNORMAL HIGH (ref 70–99)
Potassium: 3.2 mmol/L — ABNORMAL LOW (ref 3.5–5.1)
Sodium: 131 mmol/L — ABNORMAL LOW (ref 135–145)

## 2020-10-29 LAB — CBC
HCT: 44.2 % (ref 39.0–52.0)
Hemoglobin: 15.2 g/dL (ref 13.0–17.0)
MCH: 29 pg (ref 26.0–34.0)
MCHC: 34.4 g/dL (ref 30.0–36.0)
MCV: 84.2 fL (ref 80.0–100.0)
Platelets: 251 10*3/uL (ref 150–400)
RBC: 5.25 MIL/uL (ref 4.22–5.81)
RDW: 11.7 % (ref 11.5–15.5)
WBC: 6.5 10*3/uL (ref 4.0–10.5)
nRBC: 0 % (ref 0.0–0.2)

## 2020-10-29 MED ORDER — POTASSIUM CHLORIDE ER 20 MEQ PO TBCR: 20.0000 meq | EXTENDED_RELEASE_TABLET | Freq: Every day | ORAL | 0 refills | Status: DC

## 2020-10-29 MED ORDER — POTASSIUM CHLORIDE ER 20 MEQ PO TBCR: 10.0000 meq | EXTENDED_RELEASE_TABLET | Freq: Every day | ORAL | 0 refills | Status: DC

## 2020-10-29 NOTE — ED Provider Notes (Signed)
[facility] EMERGENCY DEPARTMENT Provider Note   CSN: [id number] Arrival date & time: 10/29/20  1409     History Chief Complaint  Patient presents with   Lightheaded    Jonathan Rodriguez is a 34 y.o. male.  HPI   Pt is a 34 y/o male with a h/o GERD who presents to the ED today for eval of multiple complaints.  Patient states that today he has been feeling somewhat lightheaded.  He denies any vertiginous symptoms.  States he feels lightheaded at rest but when he is up moving around he feels improved.  He has some pain to the left side of his neck and behind his left ear.  He also has some pain that radiates from the left chest wall down his arm.  This is reproducible with palpation.  He notes that yesterday he ran 2 miles, did [employer] and also did jumping jacks.  He had not worked out in about a week or 2 prior to this.  He denies any shortness of breath, substernal chest pain, cough, hemoptysis, fevers, URI symptoms, nausea vomiting diarrhea, vision changes, new unilateral numbness/weakness. Denies leg pain/swelling, hemoptysis, recent trauma, recent long travel, hormone use, personal hx of cancer, or hx of DVT/PE.   Past Medical History:  Diagnosis Date   GERD (gastroesophageal reflux disease)    Neuropathy     Patient Active Problem List   Diagnosis Date Noted   Low back pain 10/06/2014   Occipital neuralgia 09/12/2014   Neck pain 09/12/2014   Vision changes 09/12/2014   Tinnitus 09/12/2014    Past Surgical History:  Procedure Laterality Date   no surgical history         Family History  Problem Relation Age of Onset   Heart attack Mother    Heart attack Paternal Grandfather     Social History   Tobacco Use   Smoking status: Never Smoker   Smokeless tobacco: Never Used  Vaping Use   Vaping Use: Never used  Substance Use Topics   Alcohol use: Yes    Comment: occ   Drug use: No    Home Medications Prior to Admission medications    Medication Sig Start Date End Date Taking? Authorizing Provider  potassium chloride 20 MEQ TBCR Take 10 mEq by mouth daily for 4 days. 10/29/20 11/02/20 Yes Jahaziel Francois S, PA-C  cyclobenzaprine (FLEXERIL) 5 MG tablet Take 1 tablet (5 mg total) by mouth at bedtime. 03/31/16   Anson Fret, MD    Allergies    Naproxen  Review of Systems   Review of Systems  Constitutional: Negative for chills and fever.  HENT: Negative for ear pain and sore throat.   Eyes: Negative for pain and visual disturbance.  Respiratory: Negative for cough and shortness of breath.   Cardiovascular: Positive for chest pain (chest wall pain).  Gastrointestinal: Negative for abdominal pain and vomiting.  Genitourinary: Negative for dysuria and hematuria.  Musculoskeletal: Positive for neck pain. Negative for myalgias.  Skin: Negative for color change and rash.  Neurological: Positive for light-headedness and headaches. Negative for weakness and numbness.  All other systems reviewed and are negative.   Physical Exam Updated Vital Signs BP 110/77    Pulse 65    Temp 98.5 F (36.9 C) (Oral)    Resp 13    Ht 5\' 7"  (1.702 m)    Wt 68 kg    SpO2 100%    BMI 23.49 kg/m   Physical Exam Vitals and  nursing note reviewed.  Constitutional:      Appearance: He is well-developed and well-nourished.  HENT:     Head: Normocephalic and atraumatic.  Eyes:     Conjunctiva/sclera: Conjunctivae normal.  Cardiovascular:     Rate and Rhythm: Normal rate and regular rhythm.     Heart sounds: Normal heart sounds. No murmur heard.   Pulmonary:     Effort: Pulmonary effort is normal. No respiratory distress.     Breath sounds: Normal breath sounds. No wheezing, rhonchi or rales.  Chest:     Chest wall: Tenderness (left chest TTP which reproduces pain) present.  Abdominal:     General: Bowel sounds are normal.     Palpations: Abdomen is soft.     Tenderness: There is no abdominal tenderness. There is no guarding or  rebound.  Musculoskeletal:        General: No edema.     Cervical back: Neck supple.     Comments: TTP to the left SCM and left masseter which reproduces pain.  Skin:    General: Skin is warm and dry.  Neurological:     Mental Status: He is alert.     Comments: Mental Status:  Alert, thought content appropriate, able to give a coherent history. Speech fluent without evidence of aphasia. Able to follow 2 step commands without difficulty.  Cranial Nerves:  II: pupils equal, round, reactive to light III,IV, VI: ptosis not present, extra-ocular motions intact bilaterally  V,VII: smile symmetric, facial light touch sensation equal VIII: hearing grossly normal to voice  X: uvula elevates symmetrically  XI: bilateral shoulder shrug symmetric and strong XII: midline tongue extension without fassiculations Motor:  Normal tone. 5/5 strength of BUE and BLE major muscle groups including strong and equal grip strength and dorsiflexion/plantar flexion Sensory: light touch normal in all extremities. Cerebellar: normal finger-to-nose with bilateral upper extremities  Psychiatric:        Mood and Affect: Mood and affect normal.     ED Results / Procedures / Treatments   Labs (all labs ordered are listed, but only abnormal results are displayed) Labs Reviewed  BASIC METABOLIC PANEL - Abnormal; Notable for the following components:      Result Value   Sodium 131 (*)    Potassium 3.2 (*)    Chloride 97 (*)    Glucose, Bld 141 (*)    All other components within normal limits  CBG MONITORING, ED - Abnormal; Notable for the following components:   Glucose-Capillary 114 (*)    All other components within normal limits  SARS CORONAVIRUS 2 (TAT 6-24 HRS)  CBC  URINALYSIS, ROUTINE W REFLEX MICROSCOPIC    EKG None  Radiology No results found.  Procedures Procedures (including critical care time)  Medications Ordered in ED Medications - No data to display  ED Course  I have reviewed  the triage vital signs and the nursing notes.  Pertinent labs & imaging results that were available during my care of the patient were reviewed by me and considered in my medical decision making (see chart for details).    MDM Rules/Calculators/A&P                          34 year old male presenting the emergency department today for evaluation of lightheadedness and various other complaints.  His neurologic exam is normal.  His cardiac and pulmonary exams are benign.  He has reproducible chest wall tenderness and tenderness along the left  SCM and left masseter which reproduces his symptoms.  He had labs ordered in triage which showed no leukocytosis or anemia.  I did show some hyponatremia, hypochloremia and hypokalemia consistent with mild dehydration.  His EKG showed normal sinus rhythm without any arrhythmia or ischemic changes.  I suspect that his lightheadedness may be secondary to some dehydration due to his exercise yesterday.  I gave Rx for potassium and advised him to push fluids.  In regards to the chest wall pain, suspect that he strained a muscle.  Advised anti-inflammatories for his muscle pain.  Advised on close follow-up with PCP and strict return precautions.  He voices understanding of the plan and reasons to return.  All questions answered.  Patient stable for discharge.  Final Clinical Impression(s) / ED Diagnoses Final diagnoses:  Lightheaded  Hypokalemia    Rx / DC Orders ED Discharge Orders         Ordered    potassium chloride 20 MEQ TBCR  Daily,   Status:  Discontinued        10/29/20 2248    Potassium Chloride ER 20 MEQ TBCR  Daily,   Status:  Discontinued        10/29/20 2254    potassium chloride 20 MEQ TBCR  Daily        10/29/20 2255           Karrie Meres, PA-C 10/29/20 2255    Glynn Octave, MD 10/29/20 2317

## 2020-10-29 NOTE — Discharge Instructions (Addendum)
You may alternate taking Tylenol and Naproxen as needed for pain control. You may take Naproxen twice daily as directed on your discharge paperwork and you may take  [phone] mg of Tylenol every 6 hours. Do not exceed 4000 mg of Tylenol daily as this can lead to liver damage. Also, make sure to take Naproxen with meals as it can cause an upset stomach. Do not take other NSAIDs while taking Naproxen such as (Aleve, Ibuprofen, Aspirin, Celebrex, etc) and do not take more than the prescribed dose as this can lead to ulcers and bleeding in your GI tract. You may use warm and cold compresses to help with your symptoms.   Take potassium as directed  Please follow up with your primary doctor within the next 7-10 days for re-evaluation and further treatment of your symptoms.   Please return to the ER sooner if you have any new or worsening symptoms.

## 2020-10-29 NOTE — ED Triage Notes (Signed)
Pt c/o feeling lightheaded, feeling cold-denies fever/flu sx-denies pain-NAD-steady gait

## 2020-10-30 LAB — SARS CORONAVIRUS 2 (TAT 6-24 HRS): SARS Coronavirus 2: NEGATIVE

## 2021-07-28 ENCOUNTER — Ambulatory Visit: Payer: 59 | Admitting: Cardiovascular Disease

## 2021-08-18 ENCOUNTER — Ambulatory Visit (INDEPENDENT_AMBULATORY_CARE_PROVIDER_SITE_OTHER): Payer: 59 | Admitting: Cardiovascular Disease

## 2021-08-18 ENCOUNTER — Encounter (HOSPITAL_BASED_OUTPATIENT_CLINIC_OR_DEPARTMENT_OTHER): Payer: Self-pay | Admitting: Cardiovascular Disease

## 2021-08-18 ENCOUNTER — Other Ambulatory Visit: Payer: Self-pay

## 2021-08-18 VITALS — BP 108/80 | HR 60 | Ht 67.0 in | Wt 155.6 lb

## 2021-08-18 DIAGNOSIS — R0789 Other chest pain: Secondary | ICD-10-CM

## 2021-08-18 DIAGNOSIS — Z01812 Encounter for preprocedural laboratory examination: Secondary | ICD-10-CM

## 2021-08-18 DIAGNOSIS — Z8249 Family history of ischemic heart disease and other diseases of the circulatory system: Secondary | ICD-10-CM | POA: Diagnosis not present

## 2021-08-18 HISTORY — DX: Family history of ischemic heart disease and other diseases of the circulatory system: Z82.49

## 2021-08-18 HISTORY — DX: Other chest pain: R07.89

## 2021-08-18 MED ORDER — METOPROLOL TARTRATE 25 MG PO TABS: ORAL_TABLET | ORAL | 0 refills | Status: DC
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
## 2021-08-18 NOTE — Assessment & Plan Note (Signed)
His mother had a heart attack at age 35.  He is having ischemic symptoms so we will get a coronary CTA to evaluate.

## 2021-08-18 NOTE — Patient Instructions (Addendum)
Medication Instructions:  TAKE METOPROLOL 25 MG 1 TABLET 2 HOURS PRIOR TO CARDIAC CT   *If you need a refill on your cardiac medications before your next appointment, please call your pharmacy*  Lab Work: BMET 1 WEEK PRIOR TO CT   If you have labs (blood work) drawn today and your tests are completely normal, you will receive your results only by: MyChart Message (if you have MyChart) OR A paper copy in the mail If you have any lab test that is abnormal or we need to change your treatment, we will call you to review the results.  Testing/Procedures: Your physician has requested that you have cardiac CT. Cardiac computed tomography (CT) is a painless test that uses an x-ray machine to take clear, detailed pictures of your heart. For further information please visit [email or web address] Please follow instruction sheet as given. THE OFFICE WILL CALL YOU TO ARRANGE ONCE YOUR INSURANCE HAS BEEN REVIEWED   Follow-Up: At [facility], you and your health needs are our priority.  As part of our continuing mission to provide you with exceptional heart care, we have created designated Provider Care Teams.  These Care Teams include your primary Cardiologist (physician) and Advanced Practice Providers (APPs -  Physician Assistants and Nurse Practitioners) who all work together to provide you with the care you need, when you need it.  We recommend signing up for the patient portal called "MyChart".  Sign up information is provided on this After Visit Summary.  MyChart is used to connect with patients for Virtual Visits (Telemedicine).  Patients are able to view lab/test results, encounter notes, upcoming appointments, etc.  Non-urgent messages can be sent to your provider as well.   To learn more about what you can do with MyChart, go to [email or web address].    Your next appointment:   2-3 month(s)  The format for your next appointment:   In Person  Provider:   Chilton Si, MD   Other  Instructions    Your cardiac CT will be scheduled at one of the below locations:   [facility] [address] [place], [place] [address] [phone]  OR  [facility] [address] [place], [place] [address] [phone]  If scheduled at [facility], please arrive at the [facility] main entrance (entrance A) of [facility] 30 minutes prior to test start time. You can use the FREE valet parking offered at the main entrance (encouraged to control the heart rate for the test) Proceed to the [facility] Radiology Department (first floor) to check-in and test prep.  If scheduled at [facility], please arrive 15 mins early for check-in and test prep.  Please follow these instructions carefully (unless otherwise directed):  Hold all erectile dysfunction medications at least 3 days (72 hrs) prior to test.  On the Night Before the Test: Be sure to Drink plenty of water. Do not consume any caffeinated/decaffeinated beverages or chocolate 12 hours prior to your test. Do not take any antihistamines 12 hours prior to your test. If the patient has contrast allergy: Patient will need a prescription for Prednisone and very clear instructions (as follows): Prednisone 50 mg - take 13 hours prior to test Take another Prednisone 50 mg 7 hours prior to test Take another Prednisone 50 mg 1 hour prior to test Take Benadryl 50 mg 1 hour prior to test Patient must complete all four doses of above prophylactic medications. Patient will need  a ride after test due to Benadryl.  On the Day of the Test: Drink plenty of water until 1 hour prior to the test. Do not eat any food 4 hours prior to the test. You may take your regular medications prior to the test.  Take metoprolol (Lopressor) two hours prior to test. HOLD Furosemide/Hydrochlorothiazide morning of the test. FEMALES- please wear  underwire-free bra if available, avoid dresses & tight clothing      After the Test: Drink plenty of water. After receiving IV contrast, you may experience a mild flushed feeling. This is normal. On occasion, you may experience a mild rash up to 24 hours after the test. This is not dangerous. If this occurs, you can take Benadryl 25 mg and increase your fluid intake. If you experience trouble breathing, this can be serious. If it is severe call 911 IMMEDIATELY. If it is mild, please call our office. If you take any of these medications: Glipizide/Metformin, Avandament, Glucavance, please do not take 48 hours after completing test unless otherwise instructed.  Please allow 2-4 weeks for scheduling of routine cardiac CTs. Some insurance companies require a pre-authorization which may delay scheduling of this test.   For non-scheduling related questions, please contact the cardiac imaging nurse navigator should you have any questions/concerns: Rockwell Alexandria, Cardiac Imaging Nurse Navigator Larey Brick, Cardiac Imaging Nurse Navigator [facility] Direct Office Dial: [phone]   For scheduling needs, including cancellations and rescheduling, please call [place], [phone].   Cardiac CT Angiogram A cardiac CT angiogram is a procedure to look at the heart and the area around the heart. It may be done to help find the cause of chest pains or other symptoms of heart disease. During this procedure, a substance called contrast dye is injected into the blood vessels in the area to be checked. A large X-ray machine, called a CT scanner, then takes detailed pictures of the heart and the surrounding area. The procedure is also sometimes called a coronary CT angiogram, coronary artery scanning, or CTA. A cardiac CT angiogram allows the health care provider to see how well blood is flowing to and from the heart. The health care provider will be able to see if there are any  problems, such as: Blockage or narrowing of the coronary arteries in the heart. Fluid around the heart. Signs of weakness or disease in the muscles, valves, and tissues of the heart. Tell a health care provider about: Any allergies you have. This is especially important if you have had a previous allergic reaction to contrast dye. All medicines you are taking, including vitamins, herbs, eye drops, creams, and over-the-counter medicines. Any blood disorders you have. Any surgeries you have had. Any medical conditions you have. Whether you are pregnant or may be pregnant. Any anxiety disorders, chronic pain, or other conditions you have that may increase your stress or prevent you from lying still. What are the risks? Generally, this is a safe procedure. However, problems may occur, including: Bleeding. Infection. Allergic reactions to medicines or dyes. Damage to other structures or organs. Kidney damage from the contrast dye that is used. Increased risk of cancer from radiation exposure. This risk is low. Talk with your health care provider about: The risks and benefits of testing. How you can receive the lowest dose of radiation. What happens before the procedure? Wear comfortable clothing and remove any jewelry, glasses, dentures, and hearing aids. Follow instructions from your health care provider about eating and drinking. This  may include: For 12 hours before the procedure -- avoid caffeine. This includes tea, coffee, soda, energy drinks, and diet pills. Drink plenty of water or other fluids that do not have caffeine in them. Being well hydrated can prevent complications. For 4-6 hours before the procedure -- stop eating and drinking. The contrast dye can cause nausea, but this is less likely if your stomach is empty. Ask your health care provider about changing or stopping your regular medicines. This is especially important if you are taking diabetes medicines, blood thinners, or  medicines to treat problems with erections (erectile dysfunction). What happens during the procedure?  Hair on your chest may need to be removed so that small sticky patches called electrodes can be placed on your chest. These will transmit information that helps to monitor your heart during the procedure. An IV will be inserted into one of your veins. You might be given a medicine to control your heart rate during the procedure. This will help to ensure that good images are obtained. You will be asked to lie on an exam table. This table will slide in and out of the CT machine during the procedure. Contrast dye will be injected into the IV. You might feel warm, or you may get a metallic taste in your mouth. You will be given a medicine called nitroglycerin. This will relax or dilate the arteries in your heart. The table that you are lying on will move into the CT machine tunnel for the scan. The person running the machine will give you instructions while the scans are being done. You may be asked to: Keep your arms above your head. Hold your breath. Stay very still, even if the table is moving. When the scanning is complete, you will be moved out of the machine. The IV will be removed. The procedure may vary among health care providers and hospitals. What can I expect after the procedure? After your procedure, it is common to have: A metallic taste in your mouth from the contrast dye. A feeling of warmth. A headache from the nitroglycerin. Follow these instructions at home: Take over-the-counter and prescription medicines only as told by your health care provider. If you are told, drink enough fluid to keep your urine pale yellow. This will help to flush the contrast dye out of your body. Most people can return to their normal activities right after the procedure. Ask your health care provider what activities are safe for you. It is up to you to get the results of your procedure. Ask your  health care provider, or the department that is doing the procedure, when your results will be ready. Keep all follow-up visits as told by your health care provider. This is important. Contact a health care provider if: You have any symptoms of allergy to the contrast dye. These include: Shortness of breath. Rash or hives. A racing heartbeat. Summary A cardiac CT angiogram is a procedure to look at the heart and the area around the heart. It may be done to help find the cause of chest pains or other symptoms of heart disease. During this procedure, a large X-ray machine, called a CT scanner, takes detailed pictures of the heart and the surrounding area after a contrast dye has been injected into blood vessels in the area. Ask your health care provider about changing or stopping your regular medicines before the procedure. This is especially important if you are taking diabetes medicines, blood thinners, or medicines to treat erectile  dysfunction. If you are told, drink enough fluid to keep your urine pale yellow. This will help to flush the contrast dye out of your body. This information is not intended to replace advice given to you by your health care provider. Make sure you discuss any questions you have with your health care provider. Document Revised: 06/13/2019 Document Reviewed: 06/13/2019 Elsevier Patient Education  2022 [employer].
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
## 2021-08-21 LAB — BASIC METABOLIC PANEL
BUN/Creatinine Ratio: 9 (ref 9–20)
BUN: 10 mg/dL (ref 6–20)
CO2: 17 mmol/L — ABNORMAL LOW (ref 20–29)
Calcium: 9.6 mg/dL (ref 8.7–10.2)
Chloride: 102 mmol/L (ref 96–106)
Creatinine, Ser: 1.06 mg/dL (ref 0.76–1.27)
Glucose: 104 mg/dL — ABNORMAL HIGH (ref 70–99)
Potassium: 4.8 mmol/L (ref 3.5–5.2)
Sodium: 137 mmol/L (ref 134–144)
eGFR: 94 mL/min/{1.73_m2} (ref >59)

## 2021-08-24 ENCOUNTER — Ambulatory Visit: Payer: 59 | Admitting: Cardiology

## 2021-08-25 ENCOUNTER — Telehealth (HOSPITAL_COMMUNITY): Payer: Self-pay | Admitting: Emergency Medicine

## 2021-08-25 NOTE — Telephone Encounter (Signed)
Reaching out to patient to offer assistance regarding upcoming cardiac imaging study; pt verbalizes understanding of appt date/time, parking situation and where to check in, pre-test NPO status and medications ordered, and verified current allergies; name and call back number provided for further questions should they arise Rockwell Alexandria RN Navigator Cardiac Imaging [facility] [phone] office [phone] cell  25mg  metoprolol  Denies iv issues

## 2021-08-26 ENCOUNTER — Other Ambulatory Visit: Payer: Self-pay

## 2021-08-26 ENCOUNTER — Ambulatory Visit (HOSPITAL_COMMUNITY)
Admission: RE | Admit: 2021-08-26 | Discharge: 2021-08-26 | Disposition: A | Discharge status: Home / Self Care | Payer: 59 | Source: Ambulatory Visit | Attending: Cardiovascular Disease | Admitting: Cardiovascular Disease

## 2021-08-26 DIAGNOSIS — R0789 Other chest pain: Secondary | ICD-10-CM | POA: Insufficient documentation

## 2021-08-26 DIAGNOSIS — Z8249 Family history of ischemic heart disease and other diseases of the circulatory system: Secondary | ICD-10-CM | POA: Diagnosis present

## 2021-08-26 MED ORDER — NITROGLYCERIN 0.4 MG SL SUBL
0.8000 mg | SUBLINGUAL_TABLET | Freq: Once | SUBLINGUAL | Status: AC
  Administered 2021-08-26: 0.8 mg via SUBLINGUAL

## 2021-08-26 MED ORDER — NITROGLYCERIN 0.4 MG SL SUBL
SUBLINGUAL_TABLET | SUBLINGUAL | Status: AC
  Filled 2021-08-26: qty 2

## 2021-08-26 MED ORDER — IOHEXOL 350 MG/ML SOLN
95.0000 mL | Freq: Once | INTRAVENOUS | Status: AC | PRN
  Administered 2021-08-26: 95 mL via INTRAVENOUS

## 2021-11-04 ENCOUNTER — Ambulatory Visit (INDEPENDENT_AMBULATORY_CARE_PROVIDER_SITE_OTHER): Payer: 59 | Admitting: Cardiovascular Disease

## 2021-11-04 ENCOUNTER — Encounter (HOSPITAL_BASED_OUTPATIENT_CLINIC_OR_DEPARTMENT_OTHER): Payer: Self-pay | Admitting: Cardiovascular Disease

## 2021-11-04 ENCOUNTER — Other Ambulatory Visit: Payer: Self-pay

## 2021-11-04 DIAGNOSIS — Z8249 Family history of ischemic heart disease and other diseases of the circulatory system: Secondary | ICD-10-CM

## 2021-11-04 DIAGNOSIS — R0789 Other chest pain: Secondary | ICD-10-CM

## 2021-11-04 NOTE — Assessment & Plan Note (Signed)
He had atypical symptoms and was referred for coronary CTA that showed no coronary disease 08/2021.  Continue with primary prevention with regular exercise, maintaining a healthy body weight, ambulate awaiting fried foods, fatty foods, red meat, and cheese.
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
## 2021-11-04 NOTE — Assessment & Plan Note (Signed)
Attributable to GERD.  Continue PPI and GI follow-up.

## 2021-11-04 NOTE — Patient Instructions (Signed)
Medication Instructions:  ?Your physician recommends that you continue on your current medications as directed. Please refer to the Current Medication list given to you today.  ? ?*If you need a refill on your cardiac medications before your next appointment, please call your pharmacy* ? ?Lab Work: ?NONE ? ?Testing/Procedures: ?NONE ? ?Follow-Up: ?At [facility], you and your health needs are our priority.  As part of our continuing mission to provide you with exceptional heart care, we have created designated Provider Care Teams.  These Care Teams include your primary Cardiologist (physician) and Advanced Practice Providers (APPs -  Physician Assistants and Nurse Practitioners) who all work together to provide you with the care you need, when you need it. ? ?We recommend signing up for the patient portal called "MyChart".  Sign up information is provided on this After Visit Summary.  MyChart is used to connect with patients for Virtual Visits (Telemedicine).  Patients are able to view lab/test results, encounter notes, upcoming appointments, etc.  Non-urgent messages can be sent to your provider as well.   ?To learn more about what you can do with MyChart, go to [email or web address].   ? ?Your next appointment:   ?12 month(s) ? ?The format for your next appointment:   ?In Person ? ?Provider:   ?Tiffany Chicora, MD  ? ? ? ? ? ? ?

## 2022-10-22 IMAGING — CT CT HEART MORP W/ CTA COR W/ SCORE W/ CA W/CM &/OR W/O CM
4 of 7 series · 8 of 20 positions shown, 9 images · IV contrast (omnipaque)
Comparison: None.

Addendum:
HISTORY: Chest pain/anginal equiv, 10yr CHD risk < 10%, treadmill candidate

EXAM:
Cardiac/Coronary CT
TECHNIQUE: The patient was scanned on a Siemens Force scanner.
PROTOCOL: A 100 kV prospective scan was triggered in the descending thoracic
aorta at 111 HU's. Axial non-contrast 3 mm slices were carried out
through the heart. The data set was analyzed on a dedicated work
station and scored using the Agatson method. Gantry rotation speed
was 250 msecs and collimation was 0.6 mm. Heart rate optimized
medically, and 0.8 mg of sublingual nitroglycerin was given. The 3D
data set was reconstructed in 5% intervals of 35-75% of the R-R
cycle. Diastolic phases were analyzed on a dedicated work station
using MPR, MIP and VRT modes. The patient received 95mL OMNIPAQUE
IOHEXOL 350 MG/ML SOLN of contrast.

[Series 6: ts diast sharp · axial · 0.39mm/px · z∈[-324,-286]mm · 2 of 286 slices shown]
[im 96/286  lung]
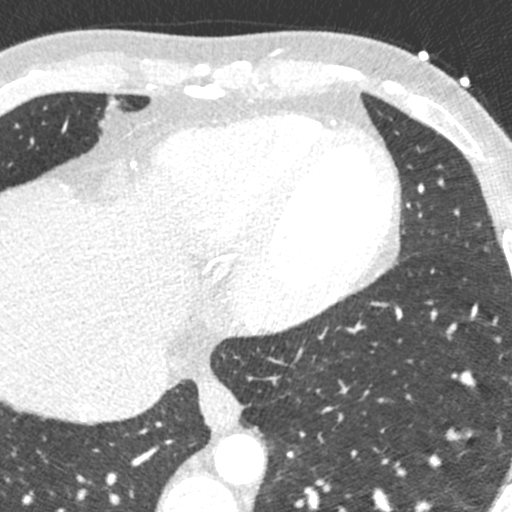
[im 191/286  lung]
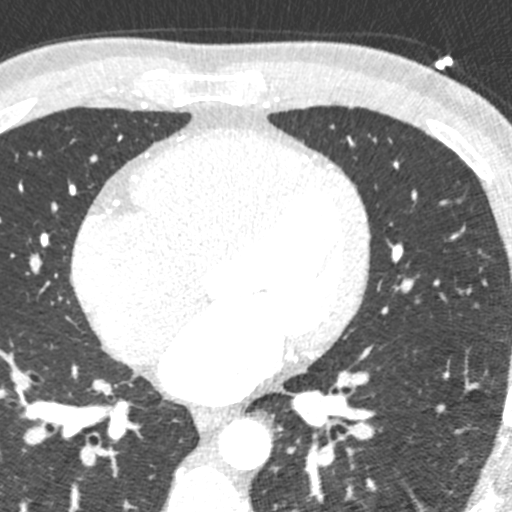

[Series 7: best diast · axial · 0.39mm/px · z∈[-324,-286]mm · 2 of 286 slices shown, 3 images]
[im 96/286  vessel]
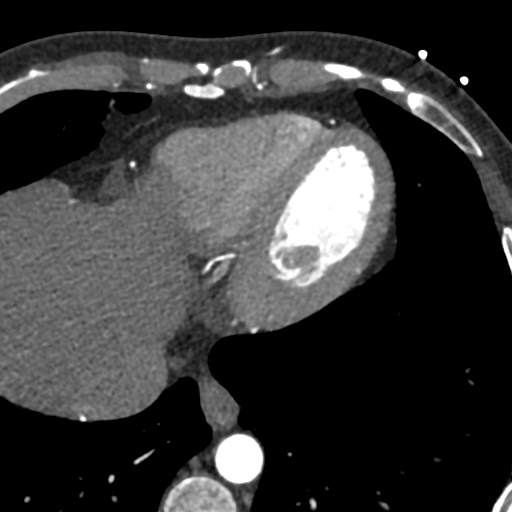
[im 96/286  lung]
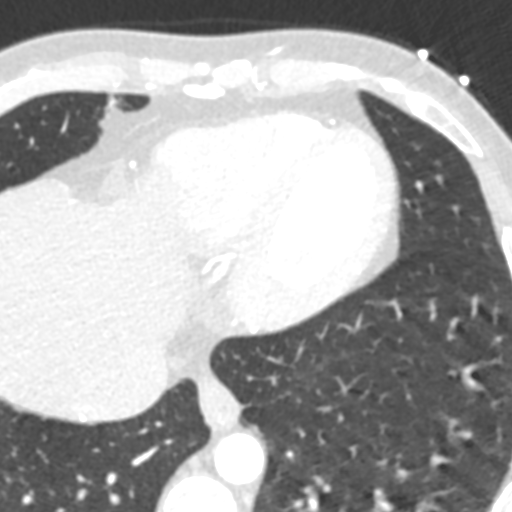
[im 191/286  vessel]
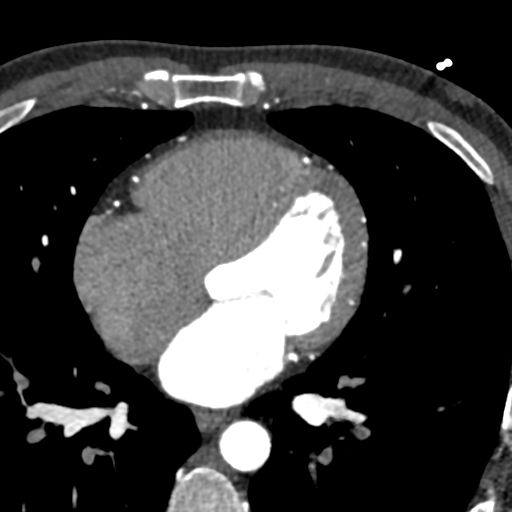

[Series 8: best syst · axial · 0.39mm/px · z∈[-324,-286]mm · 2 of 286 slices shown]
[im 96/286  vessel]
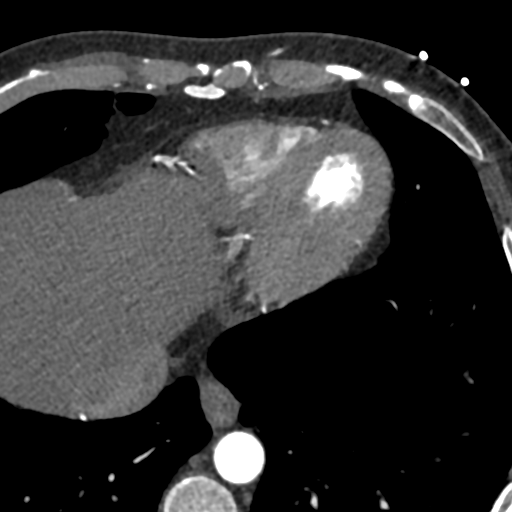
[im 191/286  vessel]
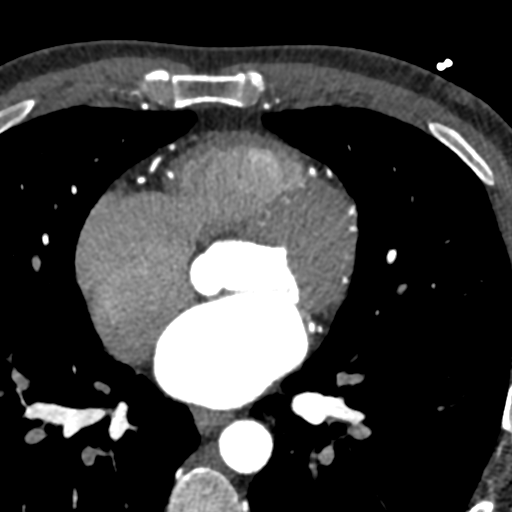

[Series 9: ts syst sharp · axial · 0.39mm/px · z∈[-324,-286]mm · 2 of 286 slices shown]
[im 96/286  lung]
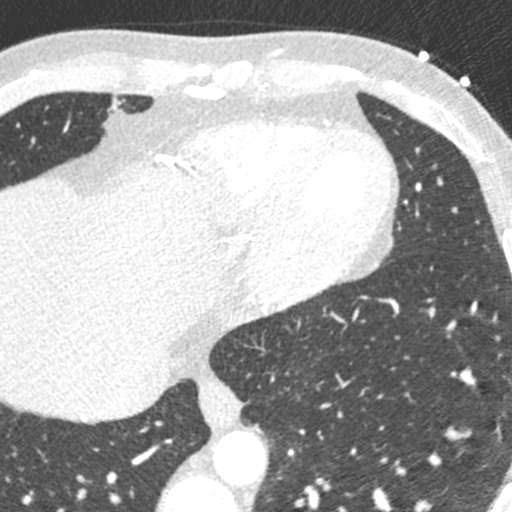
[im 191/286  lung]
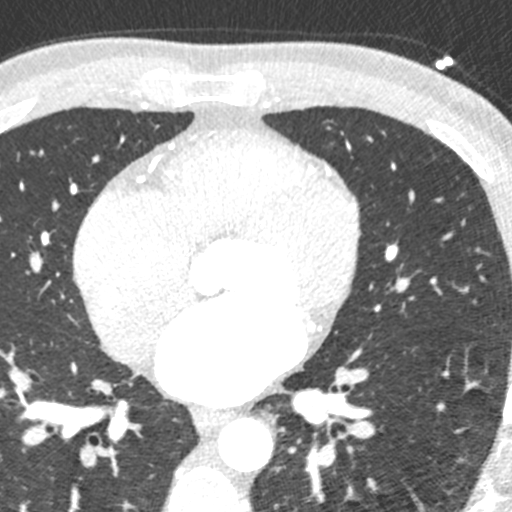

[8 of 20 positions shown; findings below may reference images not displayed]

FINDINGS: Coronary calcium score: The patient's coronary artery calcium score
is 0, which places the patient in the 0 percentile.

Coronary arteries: Normal coronary origins.  Right dominance.

Right Coronary Artery: Normal caliber vessel, gives rise to PDA. No
significant plaque or stenosis.

Left Main Coronary Artery: Normal caliber vessel. No significant
plaque or stenosis.

Left Anterior Descending Coronary Artery: Normal caliber vessel. No
significant plaque or stenosis. Gives rise to large D1 and D2
diagonal branches.

Left Circumflex Artery: Normal caliber vessel. No significant plaque
or stenosis. Gives rise to 3 OM branches.

Aorta: Normal size, 24 mm at the mid ascending aorta (level of the
PA bifurcation) measured double oblique. No calcifications. No
dissection seen in visualized portions of the aorta.

Aortic Valve: No calcifications. Trileaflet.

Other findings:

Normal pulmonary vein drainage into the left atrium. There is a
single chordlike structure seen in the left atrium (see captured
images) without division of the atrium.

Normal left atrial appendage without a thrombus.

Normal size of the pulmonary artery.

Normal appearance of the pericardium.
IMPRESSION: 1. No evidence of CAD, CADRADS = 0.

2. Coronary calcium score of 0. This was 0 percentile for age and
sex matched control.

3. Normal coronary origin with right dominance.

INTERPRETATION:

1. CAD-RADS 0: No evidence of CAD (0%). Consider non-atherosclerotic
causes of chest pain.

2. CAD-RADS 1: Minimal non-obstructive CAD (0-24%). Consider
non-atherosclerotic causes of chest pain. Consider preventive
therapy and risk factor modification.

3. CAD-RADS 2: Mild non-obstructive CAD (25-49%). Consider
non-atherosclerotic causes of chest pain. Consider preventive
therapy and risk factor modification.

4. CAD-RADS 3: Moderate stenosis (50-69%). Consider symptom-guided
anti-ischemic pharmacotherapy as well as risk factor modification
per guideline directed care. Additional analysis with CT FFR will be
submitted.

5. CAD-RADS 4: Severe stenosis. (70-99% or > 50% left main). Cardiac
catheterization or CT FFR is recommended. Consider symptom-guided
anti-ischemic pharmacotherapy as well as risk factor modification
per guideline directed care. Invasive coronary angiography
recommended with revascularization per published guideline
statements.

6. CAD-RADS 5: Total coronary occlusion (100%). Consider cardiac
catheterization or viability assessment. Consider symptom-guided
anti-ischemic pharmacotherapy as well as risk factor modification
per guideline directed care.

7. CAD-RADS N: Non-diagnostic study. Obstructive CAD can't be
excluded. Alternative evaluation is recommended.

EXAM:
OVER-READ INTERPRETATION  CT CHEST

The following report is an over-read performed by radiologist Dr.
over-read does not include interpretation of cardiac or coronary
anatomy or pathology. The coronary CTA interpretation by the
cardiologist is attached.
FINDINGS: No mediastinal mass or adenopathy.

No pleural effusion. No airspace consolidation, atelectasis or
pneumothorax. No suspicious lung nodules.

No acute findings within the imaged portions of the upper abdomen.

No acute or suspicious osseous findings.
IMPRESSION: Negative over-read.

*** End of Addendum ***
FINDINGS: Coronary calcium score: The patient's coronary artery calcium score
is 0, which places the patient in the 0 percentile.

Coronary arteries: Normal coronary origins.  Right dominance.

Right Coronary Artery: Normal caliber vessel, gives rise to PDA. No
significant plaque or stenosis.

Left Main Coronary Artery: Normal caliber vessel. No significant
plaque or stenosis.

Left Anterior Descending Coronary Artery: Normal caliber vessel. No
significant plaque or stenosis. Gives rise to large D1 and D2
diagonal branches.

Left Circumflex Artery: Normal caliber vessel. No significant plaque
or stenosis. Gives rise to 3 OM branches.

Aorta: Normal size, 24 mm at the mid ascending aorta (level of the
PA bifurcation) measured double oblique. No calcifications. No
dissection seen in visualized portions of the aorta.

Aortic Valve: No calcifications. Trileaflet.

Other findings:

Normal pulmonary vein drainage into the left atrium. There is a
single chordlike structure seen in the left atrium (see captured
images) without division of the atrium.

Normal left atrial appendage without a thrombus.

Normal size of the pulmonary artery.

Normal appearance of the pericardium.
IMPRESSION: 1. No evidence of CAD, CADRADS = 0.

2. Coronary calcium score of 0. This was 0 percentile for age and
sex matched control.

3. Normal coronary origin with right dominance.

INTERPRETATION:

1. CAD-RADS 0: No evidence of CAD (0%). Consider non-atherosclerotic
causes of chest pain.

2. CAD-RADS 1: Minimal non-obstructive CAD (0-24%). Consider
non-atherosclerotic causes of chest pain. Consider preventive
therapy and risk factor modification.

3. CAD-RADS 2: Mild non-obstructive CAD (25-49%). Consider
non-atherosclerotic causes of chest pain. Consider preventive
therapy and risk factor modification.

4. CAD-RADS 3: Moderate stenosis (50-69%). Consider symptom-guided
anti-ischemic pharmacotherapy as well as risk factor modification
per guideline directed care. Additional analysis with CT FFR will be
submitted.

5. CAD-RADS 4: Severe stenosis. (70-99% or > 50% left main). Cardiac
catheterization or CT FFR is recommended. Consider symptom-guided
anti-ischemic pharmacotherapy as well as risk factor modification
per guideline directed care. Invasive coronary angiography
recommended with revascularization per published guideline
statements.

6. CAD-RADS 5: Total coronary occlusion (100%). Consider cardiac
catheterization or viability assessment. Consider symptom-guided
anti-ischemic pharmacotherapy as well as risk factor modification
per guideline directed care.

7. CAD-RADS N: Non-diagnostic study. Obstructive CAD can't be
excluded. Alternative evaluation is recommended.

## 2023-05-27 ENCOUNTER — Other Ambulatory Visit (HOSPITAL_COMMUNITY): Payer: Self-pay | Admitting: Gastroenterology

## 2023-05-27 DIAGNOSIS — R748 Abnormal levels of other serum enzymes: Secondary | ICD-10-CM

## 2023-06-07 ENCOUNTER — Ambulatory Visit (HOSPITAL_COMMUNITY)
Admission: RE | Admit: 2023-06-07 | Discharge: 2023-06-07 | Disposition: A | Discharge status: Home / Self Care | Payer: 59 | Source: Ambulatory Visit | Attending: Gastroenterology | Admitting: Gastroenterology

## 2023-06-07 DIAGNOSIS — R748 Abnormal levels of other serum enzymes: Secondary | ICD-10-CM | POA: Diagnosis present

## 2023-06-07 MED ORDER — IOHEXOL 300 MG/ML  SOLN
100.0000 mL | Freq: Once | INTRAMUSCULAR | Status: AC | PRN
  Administered 2023-06-07: 100 mL via INTRAVENOUS

## 2023-09-27 ENCOUNTER — Ambulatory Visit: Payer: Self-pay | Admitting: Surgery

## 2023-09-27 DIAGNOSIS — Z01818 Encounter for other preprocedural examination: Secondary | ICD-10-CM

## 2023-09-27 NOTE — Progress Notes (Signed)
Sent message, via epic in basket, requesting orders in epic from surgeon.  

## 2023-10-03 NOTE — Patient Instructions (Signed)
SURGICAL WAITING ROOM VISITATION  Patients having surgery or a procedure may have no more than 2 support people in the waiting area - these visitors may rotate.    Children under the age of 69 must have an adult with them who is not the patient.  Due to an increase in RSV and influenza rates and associated hospitalizations, children ages 73 and under may not visit patients in [facility] hospitals.  If the patient needs to stay at the hospital during part of their recovery, the visitor guidelines for inpatient rooms apply. Pre-op nurse will coordinate an appropriate time for 1 support person to accompany patient in pre-op.  This support person may not rotate.    Please refer to the [facility] website for the visitor guidelines for Inpatients (after your surgery is over and you are in a regular room).       Your procedure is scheduled on: 10/17/23   Report to [facility] Main Entrance    Report to admitting at 5:15 AM   Call this number if you have problems the morning of surgery [phone]   Do not eat food OR DRINK LIQUIDS  :After Midnight.       FOLLOW BOWEL PREP AND ANY ADDITIONAL PRE OP INSTRUCTIONS YOU RECEIVED FROM YOUR SURGEON'S OFFICE!!!     Oral Hygiene is also important to reduce your risk of infection.                                    Remember - BRUSH YOUR TEETH THE MORNING OF SURGERY WITH YOUR REGULAR TOOTHPASTE   Stop all vitamins and herbal supplements 7 days before surgery.   Take these medicines the morning of surgery with A SIP OF WATER: PANTOPRAZOLE(PROTONIX)             You may not have any metal on your body including hair pins, jewelry, and body piercing             Do not wear make-up, lotions, powders, perfumes/cologne, or deodorant              Men may shave face and neck.   Do not bring valuables to the hospital. [facility] IS NOT             RESPONSIBLE   FOR VALUABLES.   Contacts, glasses, dentures or bridgework may not be worn  into surgery.  DO NOT BRING YOUR HOME MEDICATIONS TO THE HOSPITAL. PHARMACY WILL DISPENSE MEDICATIONS LISTED ON YOUR MEDICATION LIST TO YOU DURING YOUR ADMISSION IN THE HOSPITAL!    Patients discharged on the day of surgery will not be allowed to drive home.  Someone NEEDS to stay with you for the first 24 hours after anesthesia.   Special Instructions: Bring a copy of your healthcare power of attorney and living will documents the day of surgery if you haven't scanned them before.              Please read over the following fact sheets you were given: IF YOU HAVE QUESTIONS ABOUT YOUR PRE-OP INSTRUCTIONS PLEASE CALL [phone] Jonathan Rodriguez   If you received a COVID test during your pre-op visit  it is requested that you wear a mask when out in public, stay away from anyone that may not be feeling well and notify your surgeon if you develop symptoms. If you test positive for Covid or have been in contact with  anyone that has tested positive in the last 10 days please notify you surgeon.    [facility] - Preparing for Surgery Before surgery, you can play an important role.  Because skin is not sterile, your skin needs to be as free of germs as possible.  You can reduce the number of germs on your skin by washing with CHG (chlorahexidine gluconate) soap before surgery.  CHG is an antiseptic cleaner which kills germs and bonds with the skin to continue killing germs even after washing. Please DO NOT use if you have an allergy to CHG or antibacterial soaps.  If your skin becomes reddened/irritated stop using the CHG and inform your nurse when you arrive at Short Stay. Do not shave (including legs and underarms) for at least 48 hours prior to the first CHG shower.  You may shave your face/neck.  Please follow these instructions carefully:  1.  Shower with CHG Soap the night before surgery and the  morning of surgery.  2.  If you choose to wash your hair, wash your hair first as usual with your normal   shampoo.  3.  After you shampoo, rinse your hair and body thoroughly to remove the shampoo.                             4.  Use CHG as you would any other liquid soap.  You can apply chg directly to the skin and wash.  Gently with a scrungie or clean washcloth.  5.  Apply the CHG Soap to your body ONLY FROM THE NECK DOWN.   Do   not use on face/ open                           Wound or open sores. Avoid contact with eyes, ears mouth and   genitals (private parts).                       Wash face,  Genitals (private parts) with your normal soap.             6.  Wash thoroughly, paying special attention to the area where your    surgery  will be performed.  7.  Thoroughly rinse your body with warm water from the neck down.  8.  DO NOT shower/wash with your normal soap after using and rinsing off the CHG Soap.                9.  Pat yourself dry with a clean towel.            10.  Wear clean pajamas.            11.  Place clean sheets on your bed the night of your first shower and do not  sleep with pets. Day of Surgery : Do not apply any lotions/deodorants the morning of surgery.  Please wear clean clothes to the hospital/surgery center.  FAILURE TO FOLLOW THESE INSTRUCTIONS MAY RESULT IN THE CANCELLATION OF YOUR SURGERY  PATIENT SIGNATURE_________________________________  NURSE SIGNATURE__________________________________  ________________________________________________________________________

## 2023-10-03 NOTE — Progress Notes (Signed)
COVID Vaccine received:  []  No [x]  Yes Date of any COVID positive Test in last 90 days: no PCP - Tally Joe MD Cardiologist - Chilton Si MD  Chest x-ray -  EKG -   Stress Test -  ECHO -  Cardiac Cath -   Bowel Prep - [x]  No  []   Yes ______  Pacemaker / ICD device [x]  No []  Yes   Spinal Cord Stimulator:[x]  No []  Yes       History of Sleep Apnea? [x]  No []  Yes   CPAP used?- [x]  No []  Yes    Does the patient monitor blood sugar?          [x]  No []  Yes  []  N/A  Patient has: [x]  NO Hx DM   []  Pre-DM                 []  DM1  []   DM2 Does patient have a [employer] or Dexacom? []  No []  Yes   Fasting Blood Sugar Ranges-  Checks Blood Sugar _____ times a day  GLP1 agonist / usual dose - no GLP1 instructions:  SGLT-2 inhibitors / usual dose - no SGLT-2 instructions:   Blood Thinner / Instructions:no Aspirin Instructions:no  Comments:   Activity level: Patient is able  to climb a flight of stairs without difficulty; [x]  No CP  [x]  No SOB___   Patient can /t perform ADLs without assistance.   Anesthesia review:   Patient denies shortness of breath, fever, cough and chest pain at PAT appointment.  Patient verbalized understanding and agreement to the Pre-Surgical Instructions that were given to them at this PAT appointment. Patient was also educated of the need to review these PAT instructions again prior to his/her surgery.I reviewed the appropriate phone numbers to call if they have any and questions or concerns.

## 2023-10-06 ENCOUNTER — Encounter (HOSPITAL_COMMUNITY): Payer: Self-pay

## 2023-10-06 ENCOUNTER — Other Ambulatory Visit: Payer: Self-pay

## 2023-10-06 ENCOUNTER — Encounter (HOSPITAL_COMMUNITY)
Admission: RE | Admit: 2023-10-06 | Discharge: 2023-10-06 | Disposition: A | Discharge status: Home / Self Care | Payer: 59 | Source: Ambulatory Visit | Attending: Surgery | Admitting: Surgery

## 2023-10-06 DIAGNOSIS — Z01818 Encounter for other preprocedural examination: Secondary | ICD-10-CM | POA: Diagnosis present

## 2023-10-06 LAB — CBC WITH DIFFERENTIAL/PLATELET
Abs Immature Granulocytes: 0.01 10*3/uL (ref 0.00–0.07)
Basophils Absolute: 0 10*3/uL (ref 0.0–0.1)
Basophils Relative: 1 %
Eosinophils Absolute: 0.3 10*3/uL (ref 0.0–0.5)
Eosinophils Relative: 4 %
HCT: 41.2 % (ref 39.0–52.0)
Hemoglobin: 13.9 g/dL (ref 13.0–17.0)
Immature Granulocytes: 0 %
Lymphocytes Relative: 40 %
Lymphs Abs: 2.6 10*3/uL (ref 0.7–4.0)
MCH: 28.3 pg (ref 26.0–34.0)
MCHC: 33.7 g/dL (ref 30.0–36.0)
MCV: 83.9 fL (ref 80.0–100.0)
Monocytes Absolute: 0.6 10*3/uL (ref 0.1–1.0)
Monocytes Relative: 9 %
Neutro Abs: 3 10*3/uL (ref 1.7–7.7)
Neutrophils Relative %: 46 %
Platelets: 278 10*3/uL (ref 150–400)
RBC: 4.91 MIL/uL (ref 4.22–5.81)
RDW: 12.2 % (ref 11.5–15.5)
WBC: 6.6 10*3/uL (ref 4.0–10.5)
nRBC: 0 % (ref 0.0–0.2)

## 2023-10-06 LAB — TYPE AND SCREEN
ABO/RH(D): B POS
Antibody Screen: NEGATIVE

## 2023-10-06 LAB — BASIC METABOLIC PANEL
Anion gap: 9 (ref 5–15)
BUN: 16 mg/dL (ref 6–20)
CO2: 23 mmol/L (ref 22–32)
Calcium: 9.3 mg/dL (ref 8.9–10.3)
Chloride: 105 mmol/L (ref 98–111)
Creatinine, Ser: 0.8 mg/dL (ref 0.61–1.24)
GFR, Estimated: >60 mL/min (ref >60)
Glucose, Bld: 117 mg/dL — ABNORMAL HIGH (ref 70–99)
Potassium: 3.8 mmol/L (ref 3.5–5.1)
Sodium: 137 mmol/L (ref 135–145)

## 2023-10-15 NOTE — Anesthesia Preprocedure Evaluation (Signed)
Anesthesia Evaluation  Patient identified by MRN, date of birth, ID band Patient awake    Reviewed: Allergy & Precautions, NPO status , Patient's Chart, lab work & pertinent test results  Airway Mallampati: I  TM Distance: >3 FB Neck ROM: Full    Dental no notable dental hx. (+) Teeth Intact, Dental Advisory Given   Pulmonary    Pulmonary exam normal breath sounds clear to auscultation       Cardiovascular negative cardio ROS Normal cardiovascular exam Rhythm:Regular Rate:Normal     Neuro/Psych  Headaches  Neuromuscular disease    GI/Hepatic ,GERD  Medicated and Controlled,,  Endo/Other    Renal/GU Lab Results      Component                Value               Date                           K                        3.8                 10/06/2023                CO2                      23                  10/06/2023                BUN                      16                  10/06/2023                CREATININE               0.80                10/06/2023                G     Musculoskeletal negative musculoskeletal ROS (+)    Abdominal   Peds  Hematology negative hematology ROS (+) Lab Results      Component                Value               Date                      WBC                      6.6                 10/06/2023                HGB                      13.9                10/06/2023                HCT  41.2                10/06/2023                MCV                      83.9                10/06/2023                PLT                      278                 10/06/2023              Anesthesia Other Findings   Reproductive/Obstetrics negative OB ROS                             Anesthesia Physical Anesthesia Plan  ASA: 1  Anesthesia Plan: General   Post-op Pain Management: Toradol IV (intra-op)*, Tylenol PO (pre-op)* and Precedex   Induction:  Intravenous  PONV Risk Score and Plan: Treatment may vary due to age or medical condition, Midazolam, Ondansetron and Dexamethasone  Airway Management Planned: Oral ETT  Additional Equipment: None  Intra-op Plan:   Post-operative Plan: Extubation in OR  Informed Consent: I have reviewed the patients History and Physical, chart, labs and discussed the procedure including the risks, benefits and alternatives for the proposed anesthesia with the patient or authorized representative who has indicated his/her understanding and acceptance.     Dental advisory given  Plan Discussed with: CRNA and Anesthesiologist  Anesthesia Plan Comments:         Anesthesia Quick Evaluation

## 2023-10-17 ENCOUNTER — Encounter (HOSPITAL_COMMUNITY): Payer: Self-pay | Admitting: Surgery

## 2023-10-17 ENCOUNTER — Other Ambulatory Visit (HOSPITAL_COMMUNITY): Payer: Self-pay

## 2023-10-17 ENCOUNTER — Other Ambulatory Visit: Payer: Self-pay

## 2023-10-17 ENCOUNTER — Encounter (HOSPITAL_COMMUNITY)
Admission: RE | Disposition: A | Discharge status: Home / Self Care | Payer: Self-pay | Source: Ambulatory Visit | Attending: Surgery

## 2023-10-17 ENCOUNTER — Ambulatory Visit (HOSPITAL_BASED_OUTPATIENT_CLINIC_OR_DEPARTMENT_OTHER): Payer: 59

## 2023-10-17 ENCOUNTER — Ambulatory Visit (HOSPITAL_COMMUNITY): Payer: 59

## 2023-10-17 ENCOUNTER — Ambulatory Visit (HOSPITAL_COMMUNITY)
Admission: RE | Admit: 2023-10-17 | Discharge: 2023-10-17 | Disposition: A | Discharge status: Home / Self Care | Payer: 59 | Source: Ambulatory Visit | Attending: Surgery | Admitting: Surgery

## 2023-10-17 DIAGNOSIS — K219 Gastro-esophageal reflux disease without esophagitis: Secondary | ICD-10-CM | POA: Diagnosis not present

## 2023-10-17 DIAGNOSIS — K388 Other specified diseases of appendix: Secondary | ICD-10-CM | POA: Diagnosis present

## 2023-10-17 DIAGNOSIS — K2270 Barrett's esophagus without dysplasia: Secondary | ICD-10-CM | POA: Insufficient documentation

## 2023-10-17 DIAGNOSIS — Z79899 Other long term (current) drug therapy: Secondary | ICD-10-CM | POA: Insufficient documentation

## 2023-10-17 HISTORY — PX: LAPAROSCOPIC APPENDECTOMY: SHX408

## 2023-10-17 LAB — ABO/RH: ABO/RH(D): B POS

## 2023-10-17 SURGERY — APPENDECTOMY, LAPAROSCOPIC
Anesthesia: General | Site: Abdomen

## 2023-10-17 MED ORDER — BUPIVACAINE-EPINEPHRINE (PF) 0.25% -1:200000 IJ SOLN
INTRAMUSCULAR | Status: DC | PRN
  Administered 2023-10-17: 20 mL

## 2023-10-17 MED ORDER — ORAL CARE MOUTH RINSE: 15.0000 mL | Freq: Once | OROMUCOSAL | Status: AC

## 2023-10-17 MED ORDER — KETOROLAC TROMETHAMINE 30 MG/ML IJ SOLN
INTRAMUSCULAR | Status: AC
  Administered 2023-10-17: 30 mg via INTRAVENOUS
  Filled 2023-10-17: qty 1

## 2023-10-17 MED ORDER — LIDOCAINE HCL (CARDIAC) PF 100 MG/5ML IV SOSY
PREFILLED_SYRINGE | INTRAVENOUS | Status: DC | PRN
  Administered 2023-10-17: 100 mg via INTRAVENOUS

## 2023-10-17 MED ORDER — MIDAZOLAM HCL 2 MG/2ML IJ SOLN
INTRAMUSCULAR | Status: AC
  Filled 2023-10-17: qty 2

## 2023-10-17 MED ORDER — DEXAMETHASONE SODIUM PHOSPHATE 10 MG/ML IJ SOLN
INTRAMUSCULAR | Status: AC
  Filled 2023-10-17: qty 1

## 2023-10-17 MED ORDER — HYDROMORPHONE HCL 1 MG/ML IJ SOLN
0.2500 mg | INTRAMUSCULAR | Status: DC | PRN
  Administered 2023-10-17: 0.5 mg via INTRAVENOUS

## 2023-10-17 MED ORDER — ACETAMINOPHEN 500 MG PO TABS: 1000.0000 mg | ORAL_TABLET | ORAL | Status: DC

## 2023-10-17 MED ORDER — SODIUM CHLORIDE 0.9 % IV SOLN
2.0000 g | INTRAVENOUS | Status: AC
  Administered 2023-10-17: 2 g via INTRAVENOUS
  Filled 2023-10-17: qty 2

## 2023-10-17 MED ORDER — ONDANSETRON HCL 4 MG/2ML IJ SOLN: 4.0000 mg | Freq: Once | INTRAMUSCULAR | Status: AC | PRN

## 2023-10-17 MED ORDER — PROPOFOL 10 MG/ML IV BOLUS
INTRAVENOUS | Status: DC | PRN
  Administered 2023-10-17: 170 mg via INTRAVENOUS

## 2023-10-17 MED ORDER — ROCURONIUM BROMIDE 100 MG/10ML IV SOLN
INTRAVENOUS | Status: DC | PRN
  Administered 2023-10-17: 50 mg via INTRAVENOUS

## 2023-10-17 MED ORDER — LACTATED RINGERS IV SOLN
INTRAVENOUS | Status: DC | PRN
  Administered 2023-10-17: 1000 mL

## 2023-10-17 MED ORDER — STERILE WATER FOR IRRIGATION IR SOLN
Status: DC | PRN
  Administered 2023-10-17: 1000 mL

## 2023-10-17 MED ORDER — ACETAMINOPHEN 500 MG PO TABS
1000.0000 mg | ORAL_TABLET | Freq: Once | ORAL | Status: AC
  Administered 2023-10-17: 1000 mg via ORAL
  Filled 2023-10-17: qty 2

## 2023-10-17 MED ORDER — BUPIVACAINE LIPOSOME 1.3 % IJ SUSP
20.0000 mL | Freq: Once | INTRAMUSCULAR | Status: DC
Start: 2023-10-17 — End: 2023-10-17

## 2023-10-17 MED ORDER — FENTANYL CITRATE (PF) 100 MCG/2ML IJ SOLN
INTRAMUSCULAR | Status: AC
  Filled 2023-10-17: qty 2

## 2023-10-17 MED ORDER — ROCURONIUM BROMIDE 10 MG/ML (PF) SYRINGE
PREFILLED_SYRINGE | INTRAVENOUS | Status: AC
  Filled 2023-10-17: qty 10

## 2023-10-17 MED ORDER — DEXAMETHASONE SODIUM PHOSPHATE 10 MG/ML IJ SOLN
INTRAMUSCULAR | Status: DC | PRN
  Administered 2023-10-17: 5 mg via INTRAVENOUS

## 2023-10-17 MED ORDER — MIDAZOLAM HCL 5 MG/5ML IJ SOLN
INTRAMUSCULAR | Status: DC | PRN
  Administered 2023-10-17: 2 mg via INTRAVENOUS

## 2023-10-17 MED ORDER — OXYCODONE HCL 5 MG PO TABS: 5.0000 mg | ORAL_TABLET | Freq: Once | ORAL | Status: AC | PRN

## 2023-10-17 MED ORDER — BUPIVACAINE-EPINEPHRINE 0.25% -1:200000 IJ SOLN
INTRAMUSCULAR | Status: AC
  Filled 2023-10-17: qty 1

## 2023-10-17 MED ORDER — TRAMADOL HCL 50 MG PO TABS
50.0000 mg | ORAL_TABLET | Freq: Four times a day (QID) | ORAL | 0 refills | Status: AC | PRN
  Filled 2023-10-17: qty 10, 3d supply, fill #0

## 2023-10-17 MED ORDER — EPHEDRINE SULFATE-NACL 50-0.9 MG/10ML-% IV SOSY
PREFILLED_SYRINGE | INTRAVENOUS | Status: DC | PRN
  Administered 2023-10-17 (×2): 5 mg via INTRAVENOUS

## 2023-10-17 MED ORDER — CHLORHEXIDINE GLUCONATE 0.12 % MT SOLN
15.0000 mL | Freq: Once | OROMUCOSAL | Status: AC
Start: 2023-10-17 — End: 2023-10-17
  Administered 2023-10-17: 15 mL via OROMUCOSAL

## 2023-10-17 MED ORDER — PROPOFOL 10 MG/ML IV BOLUS
INTRAVENOUS | Status: AC
  Filled 2023-10-17: qty 20

## 2023-10-17 MED ORDER — CHLORHEXIDINE GLUCONATE CLOTH 2 % EX PADS: 6.0000 | MEDICATED_PAD | Freq: Once | CUTANEOUS | Status: DC

## 2023-10-17 MED ORDER — ONDANSETRON HCL 4 MG/2ML IJ SOLN
INTRAMUSCULAR | Status: AC
Start: 2023-10-17 — End: ?
  Filled 2023-10-17: qty 2

## 2023-10-17 MED ORDER — KETOROLAC TROMETHAMINE 30 MG/ML IJ SOLN: 30.0000 mg | Freq: Once | INTRAMUSCULAR | Status: AC | PRN

## 2023-10-17 MED ORDER — LACTATED RINGERS IV SOLN: INTRAVENOUS | Status: DC

## 2023-10-17 MED ORDER — ONDANSETRON HCL 4 MG/2ML IJ SOLN
INTRAMUSCULAR | Status: DC | PRN
  Administered 2023-10-17: 4 mg via INTRAVENOUS

## 2023-10-17 MED ORDER — OXYCODONE HCL 5 MG PO TABS
ORAL_TABLET | ORAL | Status: AC
  Administered 2023-10-17: 5 mg via ORAL
  Filled 2023-10-17: qty 1

## 2023-10-17 MED ORDER — ONDANSETRON HCL 4 MG/2ML IJ SOLN
INTRAMUSCULAR | Status: AC
  Administered 2023-10-17: 4 mg via INTRAVENOUS
  Filled 2023-10-17: qty 2

## 2023-10-17 MED ORDER — OXYCODONE HCL 5 MG/5ML PO SOLN
5.0000 mg | Freq: Once | ORAL | Status: AC | PRN
Start: 2023-10-17 — End: 2023-10-17

## 2023-10-17 MED ORDER — LIDOCAINE HCL (PF) 2 % IJ SOLN
INTRAMUSCULAR | Status: AC
  Filled 2023-10-17: qty 5

## 2023-10-17 MED ORDER — FENTANYL CITRATE (PF) 100 MCG/2ML IJ SOLN
INTRAMUSCULAR | Status: DC | PRN
  Administered 2023-10-17 (×2): 50 ug via INTRAVENOUS

## 2023-10-17 MED ORDER — SUGAMMADEX SODIUM 200 MG/2ML IV SOLN
INTRAVENOUS | Status: DC | PRN
  Administered 2023-10-17: 200 mg via INTRAVENOUS

## 2023-10-17 MED ORDER — HYDROMORPHONE HCL 1 MG/ML IJ SOLN
INTRAMUSCULAR | Status: AC
  Administered 2023-10-17: 0.5 mg via INTRAVENOUS
  Filled 2023-10-17: qty 1

## 2023-10-17 SURGICAL SUPPLY — 44 items
APPLIER CLIP 5 13 M/L LIGAMAX5 (MISCELLANEOUS)
APPLIER CLIP ROT 10 11.4 M/L (STAPLE)
BAG COUNTER SPONGE SURGICOUNT (BAG) ×1 IMPLANT
CABLE HIGH FREQUENCY MONO STRZ (ELECTRODE) IMPLANT
CHLORAPREP W/TINT 26 (MISCELLANEOUS) ×1 IMPLANT
CLIP APPLIE 5 13 M/L LIGAMAX5 (MISCELLANEOUS) IMPLANT
CLIP APPLIE ROT 10 11.4 M/L (STAPLE) IMPLANT
COVER SURGICAL LIGHT HANDLE (MISCELLANEOUS) ×1 IMPLANT
CUTTER FLEX LINEAR 45M (STAPLE) ×1 IMPLANT
DERMABOND ADVANCED .7 DNX12 (GAUZE/BANDAGES/DRESSINGS) ×1 IMPLANT
DERMABOND ADVANCED .7 DNX6 (GAUZE/BANDAGES/DRESSINGS) IMPLANT
DRAIN CHANNEL 19F RND (DRAIN) IMPLANT
ELECT PENCIL ROCKER SW 15FT (MISCELLANEOUS) ×1 IMPLANT
ELECT REM PT RETURN 15FT ADLT (MISCELLANEOUS) ×1 IMPLANT
ENDOLOOP SUT PDS II 0 18 (SUTURE) IMPLANT
EVACUATOR SILICONE 100CC (DRAIN) IMPLANT
GLOVE BIO SURGEON STRL SZ7.5 (GLOVE) ×1 IMPLANT
GLOVE INDICATOR 8.0 STRL GRN (GLOVE) ×1 IMPLANT
GOWN STRL REUS W/ TWL XL LVL3 (GOWN DISPOSABLE) ×2 IMPLANT
IRRIG SUCT STRYKERFLOW 2 WTIP (MISCELLANEOUS) ×1
IRRIGATION SUCT STRKRFLW 2 WTP (MISCELLANEOUS) ×1 IMPLANT
KIT BASIN OR (CUSTOM PROCEDURE TRAY) ×1 IMPLANT
KIT TURNOVER KIT A (KITS) IMPLANT
PAD POSITIONING PINK XL (MISCELLANEOUS) ×1 IMPLANT
RELOAD 45 VASCULAR/THIN (ENDOMECHANICALS) ×1
RELOAD STAPLE 45 2.5 WHT GRN (ENDOMECHANICALS) IMPLANT
RELOAD STAPLE 45 3.5 BLU ETS (ENDOMECHANICALS) IMPLANT
RELOAD STAPLE TA45 3.5 REG BLU (ENDOMECHANICALS)
SCISSORS LAP 5X35 DISP (ENDOMECHANICALS) IMPLANT
SET TUBE SMOKE EVAC HIGH FLOW (TUBING) ×1 IMPLANT
SHEARS HARMONIC 36 ACE (MISCELLANEOUS) ×1 IMPLANT
SLEEVE ADV FIXATION 5X100MM (TROCAR) ×1 IMPLANT
SPIKE FLUID TRANSFER (MISCELLANEOUS) ×1 IMPLANT
SUT ETHILON 3 0 PS 1 (SUTURE) IMPLANT
SUT MNCRL AB 4-0 PS2 18 (SUTURE) ×1 IMPLANT
SYS BAG RETRIEVAL 10MM (BASKET) ×1
SYSTEM BAG RETRIEVAL 10MM (BASKET) ×1 IMPLANT
TOWEL OR 17X26 10 PK STRL BLUE (TOWEL DISPOSABLE) IMPLANT
TOWEL OR NON WOVEN STRL DISP B (DISPOSABLE) ×1 IMPLANT
TRAY FOLEY MTR SLVR 14FR STAT (SET/KITS/TRAYS/PACK) ×1 IMPLANT
TRAY FOLEY MTR SLVR 16FR STAT (SET/KITS/TRAYS/PACK) ×1 IMPLANT
TRAY LAPAROSCOPIC (CUSTOM PROCEDURE TRAY) ×1 IMPLANT
TROCAR ADV FIXATION 5X100MM (TROCAR) ×1 IMPLANT
TROCAR BALLN 12MMX100 BLUNT (TROCAR) ×1 IMPLANT

## 2023-10-17 NOTE — Op Note (Signed)
Effie Kochanski [id number]   PRE-OPERATIVE DIAGNOSIS:  Appendiceal mass  POST-OPERATIVE DIAGNOSIS:  Same  PROCEDURE: Laparoscopic appendectomy  SURGEON:  Marin Olp, MD FACS  ASSISTANT: OR Staff  ANESTHESIA: General endotracheal  EBL:   5 mL  DRAINS: None  SPECIMEN:  Appendix with cuff of cecum  COUNTS:  Sponge, needle and instrument counts were reported correct x2 at conclusion of the operation  DISPOSITION:  PACU in satisfactory condition  COMPLICATIONS: None  FINDINGS: Normal-appearing peritoneal surfaces.  Normal-appearing liver surface.  Somewhat homogenously enlarged appendix.  No discrete nodules or large masses extending from it.  It is enlarged by size criteria.  There is no significant adhesions around the appendix.  Appendectomy carried out including cuff of cecum.  DESCRIPTION:   The patient was identified & brought into the operating room. SCDs were in place and functioning. General endotracheal anesthesia was administered. Preoperative antibiotics were administered. The patient was positioned supine with left arm tucked. Hair on the abdomen was then clipped by the OR team. A foley catheter was inserted under sterile conditions. The abdomen was prepped and draped in the standard sterile fashion. A surgical timeout confirmed our plan.  A small incision was made in the infraumbilical fold. The subcutaneous tissue was dissected and the umbilical stalk identified. The stalk was grasped with a Kocher and retracted outwardly. The infraumbilical fascia was exposed and incised. Peritoneal entry was carefully made bluntly. A 0 Vicryl purse-string suture was placed and then the [facility] port was introduced into the abdomen.  CO2 insufflation commenced to . The laparoscope was inserted and confirmed no evidence of trocar site complications. The patient was then positioned in Trendelenburg. Two additional ports were placed - one in left lower quadrant and another in the  suprapubic midline taking care to stay well above the bladder - 3 fingerbreadths above the pubic symphysis. The bed was then slightly tilted to place the left side down.  The appendix is identified in the right lower quadrant.  It is homogenously enlarged.  There is no discrete area of nodular enlargement.  The peritoneal surfaces are evaluated.  They are smooth.  There is no evident nodularity or mucin.  The pelvis is normal in appearance.  The liver has a normal surface.  There are no masses.  The appendix is carefully freed from surrounding structures bluntly without difficulty. Care was taken to avoid injuring any retroperitoneal structures. The appendix was elevated.  The base of the appendix was circumferentially dissected taking care to preserve the cecum free of injury. The base was noted to be viable and healthy appearing. The terminal ileum, cecum and ascending colon also appeared normal. The mesoappendix is then divided using the harmonic scalpel, maintaining a plane close to the appendix and terminating at our previously created window. The cut edge of the mesoappendix is observed and hemostatic. The base of the appendix was then stapled with a blue load, taking a small cuff of associated cecum, but also steering clear of the ileocecal valve. The appendix was placed in an EndoBag and removed from the umbilical port site and passed off as specimen.  The right lower quadrant was conservatively irrigated. Hemostasis was noted to be achieved - taking time to inspect the ligated mesoappendix. Staple line was noted to be intact on the cecum with well formed staples and no bleeding. The right lower quadrant appeared clean and as such, no drain was placed.  The left lower quadrant and suprapubic ports were removed under direct visualization. The CO2  was exhausted from the abdomen. The umbilical fascia was then closed by tieing the 0 Vicryl suture, obliterating the fascial defect. The fascia was then  palpated and noted to be completely closed. The skin of all port sites was approximated using 4-0 Monocryl suture. The abdomen is then washed and dried. The incisions are covered with Dermabond.  He was then awakened from anesthesia, extubated, and transferred to a stretcher for transport to PACU in satisfactory condition.

## 2023-10-17 NOTE — Discharge Instructions (Addendum)
POST OP INSTRUCTIONS  DIET: As tolerated. Follow a light bland diet the first 24 hours after arrival home, such as soup, liquids, crackers, etc.  Be sure to include lots of fluids daily.  Avoid fast food or heavy meals as your are more likely to get nauseated.  Eat a low fat the next few days after surgery.  Take your usually prescribed home medications unless otherwise directed.  PAIN CONTROL: Pain is best controlled by a usual combination of three different methods TOGETHER: Ice/Heat Over the counter pain medication Prescription pain medication Most patients will experience some swelling and bruising around the surgical site.  Ice packs or heating pads (30-60 minutes up to 6 times a day) will help. Some people prefer to use ice alone, heat alone, alternating between ice & heat.  Experiment to what works for you.  Swelling and bruising can take several weeks to resolve.   It is helpful to take an over-the-counter pain medication regularly for the first few weeks: Ibuprofen (Motrin/Advil) - 200mg tabs - take 3 tabs (600mg) every 6 hours as needed for pain Acetaminophen (Tylenol) - you may take 650mg every 6 hours as needed. You can take this with motrin as they act differently on the body. If you are taking a narcotic pain medication that has acetaminophen in it, do not take over the counter tylenol at the same time.  Iii. NOTE: You may take both of these medications together - most patients  find it most helpful when alternating between the two (i.e. Ibuprofen at 6am,  tylenol at 9am, ibuprofen at 12pm ...) A  prescription for pain medication should be given to you upon discharge.  Take your pain medication as prescribed if your pain is not adequatly controlled with the over-the-counter pain reliefs mentioned above.  Avoid getting constipated.  Between the surgery and the pain medications, it is common to experience some constipation.  Increasing fluid intake and taking a fiber supplement (such as  Metamucil, Citrucel, FiberCon, MiraLax, etc) 1-2 times a day regularly will usually help prevent this problem from occurring.  A mild laxative (prune juice, Milk of Magnesia, MiraLax, etc) should be taken according to package directions if there are no bowel movements after 48 hours.    Dressing: Your incisions are covered in Dermabond which is like sterile superglue for the skin. This will come off on it's own in a couple weeks. It is waterproof and you may bathe normally starting the day after your surgery in a shower. Avoid baths/pools/lakes/oceans until your wounds have fully healed.  ACTIVITIES as tolerated:   Avoid heavy lifting (>10lbs or 1 gallon of milk) for the next 6 weeks. You may resume regular (light) daily activities beginning the next day--such as daily self-care, walking, climbing stairs--gradually increasing activities as tolerated.  If you can walk 30 minutes without difficulty, it is safe to try more intense activity such as jogging, treadmill, bicycling, low-impact aerobics.  DO NOT PUSH THROUGH PAIN.  Let pain be your guide: If it hurts to do something, don't do it. You may drive when you are no longer taking prescription pain medication, you can comfortably wear a seatbelt, and you can safely maneuver your car and apply brakes.   FOLLOW UP in our office Please call [facility] at [phone] to set up an appointment to see your surgeon in the office for a follow-up appointment approximately 2 weeks after your surgery. Make sure that you call for this appointment the day you arrive home to   insure a convenient appointment time.  9. If you have disability or family leave forms that need to be completed, you may have them completed by your primary care physician's office; for return to work instructions, please ask our office staff and they will be happy to assist you in obtaining this documentation   When to call us [phone]: Poor pain control Reactions / problems with new  medications (rash/itching, etc)  Fever over 101.5 F (38.5 C) Inability to urinate Nausea/vomiting Worsening swelling or bruising Continued bleeding from incision. Increased pain, redness, or drainage from the incision  The clinic staff is available to answer your questions during regular business hours (8:30am-5pm).  Please don't hesitate to call and ask to speak to one of our nurses for clinical concerns.   A surgeon from [facility] is always on call at the hospitals   If you have a medical emergency, go to the nearest emergency room or call 911.  [facility] [address], [address], [place], [place]  [address] MAIN: [phone] FAX: [phone] [email or web address] 

## 2023-10-17 NOTE — H&P (Signed)
CC: Here today for surgery  HPI: Jonathan Rodriguez is an 37 y.o. male with history of GERD, whom is seen in the office today as a referral by Dr. Caprice Beaver for evaluation of appendix thickening.  Amylase 154 (nml 31-110) 05/27/23  Underwent CT abdomen (without pelvis) with contrast for elevated amylase level 06/09/2023: 1. No pancreatic ductal dilation or evidence of acute inflammation. No cystic or solid hyperenhancing pancreatic lesion identified. 2. Prominent appendix measuring 9 mm with questionable wall thickening but no significant periappendiceal fluid. Findings are equivocal for early acute appendicitis, suggest clinical correlation  He reports no significant abdominal pain preceding his CT scan. He has no history of any sort of multihour or multiday history of right lower quadrant pain. He denies any blood in his stool. He has never had a colonoscopy. He reports he has had an EGD completed for reflux and has been told he has Barrett's esophagus.  PMH: GERD  PSH: Denies any prior abdominal or pelvic surgical history. Lipoma removed from forearm, 2022  FHx: Denies any known family history of colorectal, breast, endometrial or ovarian cancer  Social Hx: Denies use of tobacco/illicit drug. Social EtOH use. 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.   He denies any changes in health or health history since we met in the office. No new medications/allergies. He states he is ready for surgery today. Bowel prep was taken and with satisfactory result per his account.  Past Medical History:  Diagnosis Date   Atypical chest pain 08/18/2021   Family history of premature CAD 08/18/2021   GERD (gastroesophageal reflux disease)    Neuropathy     Past Surgical History:  Procedure Laterality Date   LIPOMA EXCISION  2021   arm and abdomen   no surgical history      Family History  Problem Relation Age of Onset   Heart attack Mother    Heart attack Paternal  Grandfather     Social:  reports that he has never smoked. He has never used smokeless tobacco. He reports current alcohol use. He reports that he does not use drugs.  Allergies:  Allergies  Allergen Reactions   Naproxen Tinitus    Medications: I have reviewed the patient's current medications.  No results found for this or any previous visit (from the past 48 hours).  No results found.   PE Blood pressure 126/81, pulse 65, temperature 97.7 F (36.5 C), temperature source Oral, resp. rate 18, height 5\' 7"  (1.702 m), weight 70.3 kg, SpO2 100%. Constitutional: NAD; conversant Eyes: Moist conjunctiva; no lid lag; anicteric Lungs: Normal respiratory effort CV: RRR GI: Abd soft, NT/ND Psychiatric: Appropriate affect  No results found for this or any previous visit (from the past 48 hours).  No results found.  A/P: Jonathan Rodriguez is an 37 y.o. male with hx of GERD here for evaluation of abnormal appearing appendix that was found on a CT scan that was completed back in August. He was referred to see [place] by Dr. Levora Angel for evaluation of surgery. He has not had a colonoscopy. Has since had this completed   -The anatomy and physiology of the GI tract was reviewed with the patient. The pathophysiology of appendiceal pathology and masses was discussed as well with associated pictures. We discussed the rationale for surgery. We have discussed that with an enlarged appendix, there is certainly the risk for something like a tumor of the appendix and/or appendiceal mucinous neoplasms. We have discussed surgery  as a means to potentially treat and reduce the risk of subsequent complications. We also discussed benign etiologies for appendiceal enlargement.  -We have discussed various different treatment options going forward including surgery (the most definitive) to address this -laparoscopic appendectomy; situations where additional surgery and/or even something like a right hemicolectomy may  be indicated  -The planned procedure, material risks (including, but not limited to, pain, bleeding, infection, scarring, need for blood transfusion, damage to surrounding structures- blood vessels/nerves/viscus/organs, damage to ureter, urine leak, leak from anastomosis, need for additional procedures, scenarios where a stoma may be necessary , worsening of pre-existing medical conditions, chronic diarrhea, constipation secondary to narcotic use, hernia, recurrence, pneumonia, heart attack, stroke, death) benefits and alternatives to surgery were discussed at length. The patient's questions were answered to his satisfaction, he voiced understanding and elected to proceed with surgery. Additionally, we discussed typical postoperative expectations and the recovery process.   Marin Olp, MD [facility]

## 2023-10-17 NOTE — Transfer of Care (Signed)
Immediate Anesthesia Transfer of Care Note  Patient: Jonathan Rodriguez  Procedure(s) Performed: APPENDECTOMY LAPAROSCOPIC (Abdomen)  Patient Location: PACU  Anesthesia Type:General  Level of Consciousness: awake, alert , oriented, and patient cooperative  Airway & Oxygen Therapy: Patient Spontanous Breathing and Patient connected to face mask oxygen  Post-op Assessment: Report given to RN, Post -op Vital signs reviewed and stable, and Patient moving all extremities  Post vital signs: Reviewed and stable  Last Vitals:  Vitals Value Taken Time  BP    Temp    Pulse    Resp    SpO2      Last Pain:  Vitals:   10/17/23 0536  TempSrc: Oral  PainSc: 0-No pain      Patients Stated Pain Goal: 4 (10/17/23 0536)  Complications: No notable events documented.

## 2023-10-17 NOTE — Anesthesia Postprocedure Evaluation (Signed)
Anesthesia Post Note  Patient: Jonathan Rodriguez  Procedure(s) Performed: APPENDECTOMY LAPAROSCOPIC (Abdomen)     Patient location during evaluation: PACU Anesthesia Type: General Level of consciousness: awake and alert Pain management: pain level controlled Vital Signs Assessment: post-procedure vital signs reviewed and stable Respiratory status: spontaneous breathing, nonlabored ventilation, respiratory function stable and patient connected to nasal cannula oxygen Cardiovascular status: blood pressure returned to baseline and stable Postop Assessment: no apparent nausea or vomiting Anesthetic complications: no   No notable events documented.  Last Vitals:  Vitals:   10/17/23 1015 10/17/23 1025  BP: 124/89 121/83  Pulse: 72 71  Resp: 12 15  Temp: 36.4 C 36.4 C  SpO2: 100% 100%    Last Pain:  Vitals:   10/17/23 1025  TempSrc:   PainSc: 3                  Trevor Iha

## 2023-10-17 NOTE — Anesthesia Procedure Notes (Signed)
Procedure Name: Intubation Date/Time: 10/17/2023 7:48 AM  Performed by: Elisabeth Cara, CRNAPre-anesthesia Checklist: Patient identified, Emergency Drugs available, Suction available, Patient being monitored and Timeout performed Patient Re-evaluated:Patient Re-evaluated prior to induction Oxygen Delivery Method: Circle system utilized Preoxygenation: Pre-oxygenation with 100% oxygen Induction Type: IV induction Ventilation: Mask ventilation without difficulty Laryngoscope Size: Mac and 4 Grade View: Grade I Tube type: Oral Tube size: 7.5 mm Number of attempts: 1 Airway Equipment and Method: Stylet Placement Confirmation: ETT inserted through vocal cords under direct vision, positive ETCO2 and breath sounds checked- equal and bilateral Secured at: 22 cm Tube secured with: Tape Dental Injury: Teeth and Oropharynx as per pre-operative assessment

## 2023-10-18 ENCOUNTER — Encounter (HOSPITAL_COMMUNITY): Payer: Self-pay | Admitting: Surgery

## 2023-10-19 LAB — SURGICAL PATHOLOGY
